# Patient Record
Sex: Male | Born: 1980 | Race: White | Hispanic: No | Marital: Married | Smoking: Former smoker
Health system: Southern US, Community
[De-identification: ages and names within clinical notes are randomized; demographics above are authoritative.]

## PROBLEM LIST (undated history)

## (undated) DIAGNOSIS — S82141A Displaced bicondylar fracture of right tibia, initial encounter for closed fracture: Secondary | ICD-10-CM

## (undated) DIAGNOSIS — E559 Vitamin D deficiency, unspecified: Secondary | ICD-10-CM

## (undated) DIAGNOSIS — S72401A Unspecified fracture of lower end of right femur, initial encounter for closed fracture: Secondary | ICD-10-CM

## (undated) DIAGNOSIS — S83206A Unspecified tear of unspecified meniscus, current injury, right knee, initial encounter: Secondary | ICD-10-CM

## (undated) DIAGNOSIS — T79A21A Traumatic compartment syndrome of right lower extremity, initial encounter: Secondary | ICD-10-CM

## (undated) HISTORY — PX: KNEE SURGERY: SHX244

---

## 1999-09-02 ENCOUNTER — Encounter: Payer: Self-pay | Admitting: Emergency Medicine

## 1999-09-02 ENCOUNTER — Emergency Department (HOSPITAL_COMMUNITY): Admission: EM | Admit: 1999-09-02 | Discharge: 1999-09-02 | Payer: Self-pay | Admitting: Emergency Medicine

## 2002-01-15 ENCOUNTER — Encounter: Payer: Self-pay | Admitting: Family Medicine

## 2002-01-15 ENCOUNTER — Ambulatory Visit (HOSPITAL_COMMUNITY): Admission: RE | Admit: 2002-01-15 | Discharge: 2002-01-15 | Payer: Self-pay | Admitting: Family Medicine

## 2015-09-26 ENCOUNTER — Emergency Department (HOSPITAL_COMMUNITY): Payer: 59

## 2015-09-26 ENCOUNTER — Encounter (HOSPITAL_COMMUNITY)
Admission: EM | Disposition: A | Discharge status: Home Health | Payer: Self-pay | Source: Home / Self Care | Attending: Orthopedic Surgery

## 2015-09-26 ENCOUNTER — Emergency Department (HOSPITAL_COMMUNITY): Payer: 59 | Admitting: Anesthesiology

## 2015-09-26 ENCOUNTER — Encounter (HOSPITAL_COMMUNITY): Payer: Self-pay | Admitting: Emergency Medicine

## 2015-09-26 ENCOUNTER — Inpatient Hospital Stay (HOSPITAL_COMMUNITY): Payer: 59

## 2015-09-26 ENCOUNTER — Inpatient Hospital Stay (HOSPITAL_COMMUNITY)
Admission: EM | Admit: 2015-09-26 | Discharge: 2015-09-30 | DRG: 481 | Disposition: A | Discharge status: Home Health | Payer: 59 | Attending: Orthopedic Surgery | Admitting: Orthopedic Surgery

## 2015-09-26 DIAGNOSIS — Z6841 Body Mass Index (BMI) 40.0 and over, adult: Secondary | ICD-10-CM | POA: Diagnosis not present

## 2015-09-26 DIAGNOSIS — E618 Deficiency of other specified nutrient elements: Secondary | ICD-10-CM | POA: Diagnosis present

## 2015-09-26 DIAGNOSIS — D62 Acute posthemorrhagic anemia: Secondary | ICD-10-CM | POA: Diagnosis not present

## 2015-09-26 DIAGNOSIS — S82291A Other fracture of shaft of right tibia, initial encounter for closed fracture: Secondary | ICD-10-CM | POA: Diagnosis present

## 2015-09-26 DIAGNOSIS — S82141A Displaced bicondylar fracture of right tibia, initial encounter for closed fracture: Secondary | ICD-10-CM | POA: Diagnosis present

## 2015-09-26 DIAGNOSIS — S72451A Displaced supracondylar fracture without intracondylar extension of lower end of right femur, initial encounter for closed fracture: Secondary | ICD-10-CM | POA: Diagnosis present

## 2015-09-26 DIAGNOSIS — M79651 Pain in right thigh: Secondary | ICD-10-CM | POA: Diagnosis present

## 2015-09-26 DIAGNOSIS — S72401A Unspecified fracture of lower end of right femur, initial encounter for closed fracture: Secondary | ICD-10-CM | POA: Diagnosis present

## 2015-09-26 DIAGNOSIS — M1711 Unilateral primary osteoarthritis, right knee: Secondary | ICD-10-CM | POA: Diagnosis present

## 2015-09-26 DIAGNOSIS — R52 Pain, unspecified: Secondary | ICD-10-CM

## 2015-09-26 DIAGNOSIS — S22088A Other fracture of T11-T12 vertebra, initial encounter for closed fracture: Secondary | ICD-10-CM | POA: Diagnosis present

## 2015-09-26 DIAGNOSIS — F17200 Nicotine dependence, unspecified, uncomplicated: Secondary | ICD-10-CM | POA: Diagnosis present

## 2015-09-26 DIAGNOSIS — F15188 Other stimulant abuse with other stimulant-induced disorder: Secondary | ICD-10-CM | POA: Diagnosis present

## 2015-09-26 DIAGNOSIS — T79A21A Traumatic compartment syndrome of right lower extremity, initial encounter: Secondary | ICD-10-CM | POA: Diagnosis present

## 2015-09-26 DIAGNOSIS — Y92488 Other paved roadways as the place of occurrence of the external cause: Secondary | ICD-10-CM | POA: Diagnosis not present

## 2015-09-26 DIAGNOSIS — Z419 Encounter for procedure for purposes other than remedying health state, unspecified: Secondary | ICD-10-CM

## 2015-09-26 DIAGNOSIS — S22089A Unspecified fracture of T11-T12 vertebra, initial encounter for closed fracture: Secondary | ICD-10-CM | POA: Diagnosis present

## 2015-09-26 DIAGNOSIS — S72409A Unspecified fracture of lower end of unspecified femur, initial encounter for closed fracture: Secondary | ICD-10-CM

## 2015-09-26 DIAGNOSIS — E58 Dietary calcium deficiency: Secondary | ICD-10-CM | POA: Diagnosis present

## 2015-09-26 DIAGNOSIS — E559 Vitamin D deficiency, unspecified: Secondary | ICD-10-CM | POA: Diagnosis present

## 2015-09-26 DIAGNOSIS — S7291XA Unspecified fracture of right femur, initial encounter for closed fracture: Secondary | ICD-10-CM

## 2015-09-26 HISTORY — DX: Displaced bicondylar fracture of right tibia, initial encounter for closed fracture: S82.141A

## 2015-09-26 HISTORY — PX: ORIF FEMUR FRACTURE: SHX2119

## 2015-09-26 HISTORY — DX: Traumatic compartment syndrome of right lower extremity, initial encounter: T79.A21A

## 2015-09-26 HISTORY — DX: Unspecified fracture of lower end of right femur, initial encounter for closed fracture: S72.401A

## 2015-09-26 HISTORY — DX: Unspecified tear of unspecified meniscus, current injury, right knee, initial encounter: S83.206A

## 2015-09-26 HISTORY — DX: Vitamin D deficiency, unspecified: E55.9

## 2015-09-26 LAB — LIPASE, BLOOD: Lipase: 76 U/L — ABNORMAL HIGH (ref 11–51)

## 2015-09-26 LAB — I-STAT CHEM 8, ED
BUN: 17 mg/dL (ref 6–20)
CHLORIDE: 103 mmol/L (ref 101–111)
CREATININE: 1 mg/dL (ref 0.61–1.24)
Calcium, Ion: 1.12 mmol/L — ABNORMAL LOW (ref 1.13–1.30)
Glucose, Bld: 126 mg/dL — ABNORMAL HIGH (ref 65–99)
HEMATOCRIT: 42 % (ref 39.0–52.0)
Hemoglobin: 14.3 g/dL (ref 13.0–17.0)
Potassium: 3.7 mmol/L (ref 3.5–5.1)
SODIUM: 141 mmol/L (ref 135–145)
TCO2: 24 mmol/L (ref 0–100)

## 2015-09-26 LAB — CBC
HEMATOCRIT: 42.3 % (ref 39.0–52.0)
HEMOGLOBIN: 13.7 g/dL (ref 13.0–17.0)
MCH: 29.2 pg (ref 26.0–34.0)
MCHC: 32.4 g/dL (ref 30.0–36.0)
MCV: 90.2 fL (ref 78.0–100.0)
Platelets: 212 10*3/uL (ref 150–400)
RBC: 4.69 MIL/uL (ref 4.22–5.81)
RDW: 13.6 % (ref 11.5–15.5)
WBC: 16.1 10*3/uL — ABNORMAL HIGH (ref 4.0–10.5)

## 2015-09-26 LAB — ABO/RH: ABO/RH(D): O POS

## 2015-09-26 LAB — TYPE AND SCREEN
ABO/RH(D): O POS
Antibody Screen: NEGATIVE

## 2015-09-26 LAB — COMPREHENSIVE METABOLIC PANEL
ALBUMIN: 4 g/dL (ref 3.5–5.0)
ALK PHOS: 72 U/L (ref 38–126)
ALT: 28 U/L (ref 17–63)
ANION GAP: 9 (ref 5–15)
AST: 24 U/L (ref 15–41)
BILIRUBIN TOTAL: 0.8 mg/dL (ref 0.3–1.2)
BUN: 16 mg/dL (ref 6–20)
CALCIUM: 9.4 mg/dL (ref 8.9–10.3)
CO2: 24 mmol/L (ref 22–32)
CREATININE: 1.07 mg/dL (ref 0.61–1.24)
Chloride: 107 mmol/L (ref 101–111)
GFR calc Af Amer: >60 mL/min (ref >60)
GFR calc non Af Amer: >60 mL/min (ref >60)
GLUCOSE: 130 mg/dL — AB (ref 65–99)
Potassium: 3.7 mmol/L (ref 3.5–5.1)
SODIUM: 140 mmol/L (ref 135–145)
TOTAL PROTEIN: 7.3 g/dL (ref 6.5–8.1)

## 2015-09-26 LAB — PROTIME-INR
INR: 1.05 (ref 0.00–1.49)
Prothrombin Time: 13.9 seconds (ref 11.6–15.2)

## 2015-09-26 SURGERY — OPEN REDUCTION INTERNAL FIXATION (ORIF) DISTAL FEMUR FRACTURE
Anesthesia: General | Site: Leg Upper | Laterality: Right

## 2015-09-26 MED ORDER — DEXTROSE 5 % IV SOLN
3.0000 g | INTRAVENOUS | Status: DC | PRN
  Administered 2015-09-26: 3 g via INTRAVENOUS

## 2015-09-26 MED ORDER — TETANUS-DIPHTH-ACELL PERTUSSIS 5-2.5-18.5 LF-MCG/0.5 IM SUSP
0.5000 mL | Freq: Once | INTRAMUSCULAR | Status: AC
  Administered 2015-09-26: 0.5 mL via INTRAMUSCULAR
  Filled 2015-09-26: qty 0.5

## 2015-09-26 MED ORDER — HYDROMORPHONE HCL 1 MG/ML IJ SOLN
1.0000 mg | Freq: Once | INTRAMUSCULAR | Status: AC
  Administered 2015-09-26: 1 mg via INTRAVENOUS
  Filled 2015-09-26: qty 1

## 2015-09-26 MED ORDER — FENTANYL CITRATE (PF) 250 MCG/5ML IJ SOLN
INTRAMUSCULAR | Status: AC
  Filled 2015-09-26: qty 5

## 2015-09-26 MED ORDER — SUCCINYLCHOLINE CHLORIDE 20 MG/ML IJ SOLN
INTRAMUSCULAR | Status: DC | PRN
  Administered 2015-09-26: 120 mg via INTRAVENOUS

## 2015-09-26 MED ORDER — SUGAMMADEX SODIUM 500 MG/5ML IV SOLN
INTRAVENOUS | Status: DC | PRN
  Administered 2015-09-26: 300 mg via INTRAVENOUS

## 2015-09-26 MED ORDER — BUPIVACAINE HCL (PF) 0.25 % IJ SOLN
INTRAMUSCULAR | Status: AC
  Filled 2015-09-26: qty 30

## 2015-09-26 MED ORDER — LACTATED RINGERS IV SOLN
INTRAVENOUS | Status: DC | PRN
  Administered 2015-09-26: 20:00:00 via INTRAVENOUS

## 2015-09-26 MED ORDER — PROPOFOL 10 MG/ML IV BOLUS
INTRAVENOUS | Status: AC
  Filled 2015-09-26: qty 20

## 2015-09-26 MED ORDER — MIDAZOLAM HCL 2 MG/2ML IJ SOLN
INTRAMUSCULAR | Status: AC
  Filled 2015-09-26: qty 2

## 2015-09-26 MED ORDER — MIDAZOLAM HCL 5 MG/5ML IJ SOLN
INTRAMUSCULAR | Status: DC | PRN
  Administered 2015-09-26: 2 mg via INTRAVENOUS

## 2015-09-26 MED ORDER — IOPAMIDOL (ISOVUE-300) INJECTION 61%
INTRAVENOUS | Status: AC
  Administered 2015-09-26: 100 mL
  Filled 2015-09-26: qty 100

## 2015-09-26 MED ORDER — ROCURONIUM BROMIDE 50 MG/5ML IV SOLN
INTRAVENOUS | Status: AC
  Filled 2015-09-26: qty 2

## 2015-09-26 MED ORDER — LIDOCAINE HCL (CARDIAC) 20 MG/ML IV SOLN
INTRAVENOUS | Status: DC | PRN
  Administered 2015-09-26: 100 mg via INTRATRACHEAL

## 2015-09-26 MED ORDER — SUGAMMADEX SODIUM 500 MG/5ML IV SOLN
INTRAVENOUS | Status: AC
  Filled 2015-09-26: qty 5

## 2015-09-26 MED ORDER — ONDANSETRON HCL 4 MG/2ML IJ SOLN
INTRAMUSCULAR | Status: DC | PRN
  Administered 2015-09-26: 4 mg via INTRAVENOUS

## 2015-09-26 MED ORDER — SUCCINYLCHOLINE CHLORIDE 200 MG/10ML IV SOSY
PREFILLED_SYRINGE | INTRAVENOUS | Status: AC
  Filled 2015-09-26: qty 10

## 2015-09-26 MED ORDER — ROCURONIUM BROMIDE 100 MG/10ML IV SOLN
INTRAVENOUS | Status: DC | PRN
  Administered 2015-09-26 (×2): 10 mg via INTRAVENOUS
  Administered 2015-09-26: 50 mg via INTRAVENOUS
  Administered 2015-09-26: 30 mg via INTRAVENOUS

## 2015-09-26 MED ORDER — PROPOFOL 10 MG/ML IV BOLUS
INTRAVENOUS | Status: DC | PRN
  Administered 2015-09-26: 300 mg via INTRAVENOUS

## 2015-09-26 MED ORDER — SODIUM CHLORIDE 0.9 % IV SOLN
Freq: Once | INTRAVENOUS | Status: AC
  Administered 2015-09-26: 14:00:00 via INTRAVENOUS

## 2015-09-26 MED ORDER — HYDROMORPHONE HCL 1 MG/ML IJ SOLN
1.0000 mg | INTRAMUSCULAR | Status: DC | PRN
  Administered 2015-09-26 (×4): 1 mg via INTRAVENOUS
  Filled 2015-09-26 (×4): qty 1

## 2015-09-26 MED ORDER — HYDROMORPHONE HCL 1 MG/ML IJ SOLN: 1.0000 mg | Freq: Once | INTRAMUSCULAR | Status: DC

## 2015-09-26 MED ORDER — HYDROMORPHONE HCL 1 MG/ML IJ SOLN
1.0000 mg | Freq: Once | INTRAMUSCULAR | Status: AC
Start: 2015-09-26 — End: 2015-09-26
  Administered 2015-09-26: 1 mg via INTRAVENOUS
  Filled 2015-09-26: qty 1

## 2015-09-26 MED ORDER — FENTANYL CITRATE (PF) 250 MCG/5ML IJ SOLN
INTRAMUSCULAR | Status: DC | PRN
  Administered 2015-09-26 (×3): 50 ug via INTRAVENOUS
  Administered 2015-09-26 (×3): 100 ug via INTRAVENOUS
  Administered 2015-09-26: 50 ug via INTRAVENOUS

## 2015-09-26 MED ORDER — ARTIFICIAL TEARS OP OINT
TOPICAL_OINTMENT | OPHTHALMIC | Status: AC
  Filled 2015-09-26: qty 3.5

## 2015-09-26 MED ORDER — LACTATED RINGERS IV SOLN
INTRAVENOUS | Status: DC | PRN
  Administered 2015-09-26 (×2): via INTRAVENOUS

## 2015-09-26 MED ORDER — LIDOCAINE 2% (20 MG/ML) 5 ML SYRINGE
INTRAMUSCULAR | Status: AC
  Filled 2015-09-26: qty 10

## 2015-09-26 MED ORDER — 0.9 % SODIUM CHLORIDE (POUR BTL) OPTIME
TOPICAL | Status: DC | PRN
  Administered 2015-09-26: 1000 mL

## 2015-09-26 SURGICAL SUPPLY — 84 items
BANDAGE ELASTIC 4 VELCRO ST LF (GAUZE/BANDAGES/DRESSINGS) ×3 IMPLANT
BANDAGE ELASTIC 6 VELCRO ST LF (GAUZE/BANDAGES/DRESSINGS) ×3 IMPLANT
BIT DRILL 100X2.5XANTM LCK (BIT) ×1 IMPLANT
BIT DRILL 4.3 (BIT) ×2
BIT DRILL 4.3MM (BIT) ×1
BIT DRILL 4.3X300MM (BIT) ×1 IMPLANT
BIT DRL 100X2.5XANTM LCK (BIT) ×1
BLADE SURG ROTATE 9660 (MISCELLANEOUS) IMPLANT
BNDG ELASTIC 6X15 VLCR STRL LF (GAUZE/BANDAGES/DRESSINGS) ×3 IMPLANT
BNDG GAUZE ELAST 4 BULKY (GAUZE/BANDAGES/DRESSINGS) ×3 IMPLANT
BRUSH SCRUB DISP (MISCELLANEOUS) ×6 IMPLANT
CANISTER SUCT 3000ML PPV (MISCELLANEOUS) ×3 IMPLANT
CAP LOCK NCB (Cap) ×21 IMPLANT
COVER SURGICAL LIGHT HANDLE (MISCELLANEOUS) ×3 IMPLANT
DRAPE C-ARM 42X72 X-RAY (DRAPES) ×3 IMPLANT
DRAPE C-ARMOR (DRAPES) ×3 IMPLANT
DRAPE IMP U-DRAPE 54X76 (DRAPES) ×3 IMPLANT
DRAPE ORTHO SPLIT 77X108 STRL (DRAPES) ×6
DRAPE SURG ORHT 6 SPLT 77X108 (DRAPES) ×3 IMPLANT
DRAPE U-SHAPE 47X51 STRL (DRAPES) ×3 IMPLANT
DRILL BIT 2.5MM (BIT) ×2
DRILL BIT NCB 2.5 (BIT) ×3 IMPLANT
DRSG ADAPTIC 3X8 NADH LF (GAUZE/BANDAGES/DRESSINGS) ×3 IMPLANT
DRSG MEPILEX BORDER 4X4 (GAUZE/BANDAGES/DRESSINGS) ×3 IMPLANT
DRSG PAD ABDOMINAL 8X10 ST (GAUZE/BANDAGES/DRESSINGS) ×12 IMPLANT
DRSG VAC ATS LRG SENSATRAC (GAUZE/BANDAGES/DRESSINGS) ×3 IMPLANT
ELECT REM PT RETURN 9FT ADLT (ELECTROSURGICAL) ×3
ELECTRODE REM PT RTRN 9FT ADLT (ELECTROSURGICAL) ×1 IMPLANT
EVACUATOR 1/8 PVC DRAIN (DRAIN) IMPLANT
EVACUATOR 3/16  PVC DRAIN (DRAIN)
EVACUATOR 3/16 PVC DRAIN (DRAIN) IMPLANT
GAUZE SPONGE 4X4 12PLY STRL (GAUZE/BANDAGES/DRESSINGS) ×3 IMPLANT
GLOVE BIO SURGEON STRL SZ7.5 (GLOVE) ×3 IMPLANT
GLOVE BIO SURGEON STRL SZ8 (GLOVE) ×6 IMPLANT
GLOVE BIOGEL PI IND STRL 7.0 (GLOVE) ×1 IMPLANT
GLOVE BIOGEL PI IND STRL 7.5 (GLOVE) ×1 IMPLANT
GLOVE BIOGEL PI IND STRL 8 (GLOVE) ×1 IMPLANT
GLOVE BIOGEL PI INDICATOR 7.0 (GLOVE) ×2
GLOVE BIOGEL PI INDICATOR 7.5 (GLOVE) ×2
GLOVE BIOGEL PI INDICATOR 8 (GLOVE) ×2
GLOVE SURG SS PI 7.0 STRL IVOR (GLOVE) ×3 IMPLANT
GOWN STRL REUS W/ TWL LRG LVL3 (GOWN DISPOSABLE) ×2 IMPLANT
GOWN STRL REUS W/ TWL XL LVL3 (GOWN DISPOSABLE) ×1 IMPLANT
GOWN STRL REUS W/TWL LRG LVL3 (GOWN DISPOSABLE) ×4
GOWN STRL REUS W/TWL XL LVL3 (GOWN DISPOSABLE) ×2
IMMOBILIZER KNEE 22 UNIV (SOFTGOODS) ×3 IMPLANT
K-WIRE 2.0 (WIRE) ×2
K-WIRE FXSTD 280X2XNS SS (WIRE) ×1
KIT BASIN OR (CUSTOM PROCEDURE TRAY) ×3 IMPLANT
KIT ROOM TURNOVER OR (KITS) ×3 IMPLANT
KWIRE FXSTD 280X2XNS SS (WIRE) ×1 IMPLANT
NEEDLE 22X1 1/2 (OR ONLY) (NEEDLE) IMPLANT
NS IRRIG 1000ML POUR BTL (IV SOLUTION) ×3 IMPLANT
PACK TOTAL JOINT (CUSTOM PROCEDURE TRAY) ×3 IMPLANT
PACK UNIVERSAL I (CUSTOM PROCEDURE TRAY) ×3 IMPLANT
PAD ARMBOARD 7.5X6 YLW CONV (MISCELLANEOUS) ×6 IMPLANT
PAD CAST 4YDX4 CTTN HI CHSV (CAST SUPPLIES) ×1 IMPLANT
PADDING CAST COTTON 4X4 STRL (CAST SUPPLIES) ×2
PADDING CAST COTTON 6X4 STRL (CAST SUPPLIES) ×3 IMPLANT
PLATE DISTAL FEMUR 15H 317M RT (Plate) ×3 IMPLANT
SCREW CANCELLOUS 5.0X90 (Screw) ×6 IMPLANT
SCREW CORTICAL NCB 5.0X40 (Screw) ×3 IMPLANT
SCREW NCB 5.0X38 (Screw) ×9 IMPLANT
SCREW NCB 5.0X85MM (Screw) ×9 IMPLANT
SPONGE GAUZE 4X4 12PLY STER LF (GAUZE/BANDAGES/DRESSINGS) ×3 IMPLANT
SPONGE LAP 18X18 X RAY DECT (DISPOSABLE) ×9 IMPLANT
STAPLER VISISTAT 35W (STAPLE) ×3 IMPLANT
SUCTION FRAZIER HANDLE 10FR (MISCELLANEOUS) ×2
SUCTION TUBE FRAZIER 10FR DISP (MISCELLANEOUS) ×1 IMPLANT
SUT ETHILON 3 0 PS 1 (SUTURE) ×3 IMPLANT
SUT PROLENE 0 CT 2 (SUTURE) IMPLANT
SUT VIC AB 0 CT1 27 (SUTURE)
SUT VIC AB 0 CT1 27XBRD ANBCTR (SUTURE) IMPLANT
SUT VIC AB 1 CT1 27 (SUTURE) ×4
SUT VIC AB 1 CT1 27XBRD ANBCTR (SUTURE) ×2 IMPLANT
SUT VIC AB 2-0 CT1 27 (SUTURE)
SUT VIC AB 2-0 CT1 TAPERPNT 27 (SUTURE) IMPLANT
SYR 20ML ECCENTRIC (SYRINGE) IMPLANT
TAPE CLOTH SURG 4X10 WHT LF (GAUZE/BANDAGES/DRESSINGS) ×3 IMPLANT
TOWEL OR 17X24 6PK STRL BLUE (TOWEL DISPOSABLE) ×6 IMPLANT
TOWEL OR 17X26 10 PK STRL BLUE (TOWEL DISPOSABLE) ×9 IMPLANT
TRAY FOLEY CATH 16FRSI W/METER (SET/KITS/TRAYS/PACK) ×3 IMPLANT
WATER STERILE IRR 1000ML POUR (IV SOLUTION) ×6 IMPLANT
YANKAUER SUCT BULB TIP NO VENT (SUCTIONS) ×3 IMPLANT
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
## 2015-09-26 NOTE — ED Notes (Signed)
Consent form signed.

## 2015-09-26 NOTE — ED Notes (Signed)
Pt returned from CT °

## 2015-09-26 NOTE — Progress Notes (Signed)
Orthopedic Tech Progress Note Patient Details:  Guy Medina [birth year] [id number]  Ortho Devices Type of Ortho Device: Ace wrap, Post (long leg) splint Ortho Device/Splint Location: RLE Ortho Device/Splint Interventions: Ordered, Application   Jennye Moccasin 09/26/2015, 5:10 PM
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
## 2015-09-26 NOTE — ED Notes (Signed)
Taken to short stay at this time.  Pain well controlled

## 2015-09-26 NOTE — ED Notes (Signed)
Pt returns from radiology. 
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
## 2015-09-26 NOTE — Anesthesia Procedure Notes (Signed)
Procedure Name: Intubation Date/Time: 09/26/2015 8:37 PM Performed by: Brien Mates D Pre-anesthesia Checklist: Patient identified, Emergency Drugs available, Patient being monitored, Suction available and Timeout performed Patient Re-evaluated:Patient Re-evaluated prior to inductionOxygen Delivery Method: Circle system utilized Preoxygenation: Pre-oxygenation with 100% oxygen Intubation Type: IV induction Laryngoscope Size: Glidescope (Elective) Grade View: Grade I Tube type: Oral Tube size: 8.0 mm Number of attempts: 1 Airway Equipment and Method: Stylet and Video-laryngoscopy Placement Confirmation: ETT inserted through vocal cords under direct vision,  positive ETCO2 and breath sounds checked- equal and bilateral Secured at: 23 cm Tube secured with: Tape Dental Injury: Teeth and Oropharynx as per pre-operative assessment

## 2015-09-26 NOTE — Anesthesia Preprocedure Evaluation (Signed)
Anesthesia Evaluation  Patient identified by MRN, date of birth, ID band Patient awake    Reviewed: Allergy & Precautions, NPO status   History of Anesthesia Complications Negative for: history of anesthetic complications  Airway Mallampati: I   Neck ROM: Full    Dental  (+) Teeth Intact   Pulmonary former smoker,    breath sounds clear to auscultation       Cardiovascular  Rhythm:Regular Rate:Normal     Neuro/Psych    GI/Hepatic negative GI ROS, Neg liver ROS,   Endo/Other  negative endocrine ROSMorbid obesity  Renal/GU negative Renal ROS     Musculoskeletal Femur fracture   Abdominal (+) + obese,   Peds  Hematology   Anesthesia Other Findings   Reproductive/Obstetrics                             Anesthesia Physical Anesthesia Plan  ASA: II and emergent  Anesthesia Plan: General   Post-op Pain Management:    Induction: Intravenous  Airway Management Planned:   Additional Equipment:   Intra-op Plan:   Post-operative Plan: Extubation in OR  Informed Consent: I have reviewed the patients History and Physical, chart, labs and discussed the procedure including the risks, benefits and alternatives for the proposed anesthesia with the patient or authorized representative who has indicated his/her understanding and acceptance.   Dental advisory given  Plan Discussed with: CRNA and Surgeon  Anesthesia Plan Comments:         Anesthesia Quick Evaluation

## 2015-09-27 ENCOUNTER — Inpatient Hospital Stay (HOSPITAL_COMMUNITY): Payer: 59

## 2015-09-27 DIAGNOSIS — S72409A Unspecified fracture of lower end of unspecified femur, initial encounter for closed fracture: Secondary | ICD-10-CM | POA: Diagnosis present

## 2015-09-27 LAB — CREATININE, SERUM
Creatinine, Ser: 1.12 mg/dL (ref 0.61–1.24)
GFR calc Af Amer: >60 mL/min (ref >60)
GFR calc non Af Amer: >60 mL/min (ref >60)

## 2015-09-27 LAB — CBC
HEMATOCRIT: 34.4 % — AB (ref 39.0–52.0)
Hemoglobin: 10.5 g/dL — ABNORMAL LOW (ref 13.0–17.0)
MCH: 28.1 pg (ref 26.0–34.0)
MCHC: 30.5 g/dL (ref 30.0–36.0)
MCV: 92 fL (ref 78.0–100.0)
PLATELETS: 174 10*3/uL (ref 150–400)
RBC: 3.74 MIL/uL — ABNORMAL LOW (ref 4.22–5.81)
RDW: 14 % (ref 11.5–15.5)
WBC: 12.6 10*3/uL — ABNORMAL HIGH (ref 4.0–10.5)

## 2015-09-27 LAB — APTT: aPTT: 27 seconds (ref 24–37)

## 2015-09-27 MED ORDER — CHLORHEXIDINE GLUCONATE 4 % EX LIQD: 60.0000 mL | Freq: Once | CUTANEOUS | Status: DC

## 2015-09-27 MED ORDER — POTASSIUM CHLORIDE 2 MEQ/ML IV SOLN: INTRAVENOUS | Status: DC

## 2015-09-27 MED ORDER — METOCLOPRAMIDE HCL 5 MG PO TABS: 5.0000 mg | ORAL_TABLET | Freq: Three times a day (TID) | ORAL | Status: DC | PRN

## 2015-09-27 MED ORDER — PHENOL 1.4 % MT LIQD: 1.0000 | OROMUCOSAL | Status: DC | PRN

## 2015-09-27 MED ORDER — ACETAMINOPHEN 325 MG PO TABS
650.0000 mg | ORAL_TABLET | Freq: Four times a day (QID) | ORAL | Status: DC | PRN
  Administered 2015-09-29 (×2): 650 mg via ORAL
  Filled 2015-09-27 (×2): qty 2

## 2015-09-27 MED ORDER — DEXTROSE 5 % IV SOLN
1000.0000 mg | Freq: Four times a day (QID) | INTRAVENOUS | Status: DC
  Administered 2015-09-27 – 2015-09-28 (×3): 1000 mg via INTRAVENOUS
  Filled 2015-09-27 (×18): qty 10

## 2015-09-27 MED ORDER — ENOXAPARIN SODIUM 40 MG/0.4ML ~~LOC~~ SOLN
40.0000 mg | SUBCUTANEOUS | Status: DC
  Administered 2015-09-27 – 2015-09-29 (×2): 40 mg via SUBCUTANEOUS
  Filled 2015-09-27 (×3): qty 0.4

## 2015-09-27 MED ORDER — DOCUSATE SODIUM 100 MG PO CAPS
100.0000 mg | ORAL_CAPSULE | Freq: Two times a day (BID) | ORAL | Status: DC
  Administered 2015-09-27 – 2015-09-30 (×6): 100 mg via ORAL
  Filled 2015-09-27 (×7): qty 1

## 2015-09-27 MED ORDER — ACETAMINOPHEN 650 MG RE SUPP: 650.0000 mg | Freq: Four times a day (QID) | RECTAL | Status: DC | PRN

## 2015-09-27 MED ORDER — POTASSIUM CHLORIDE IN NACL 20-0.9 MEQ/L-% IV SOLN
INTRAVENOUS | Status: DC
  Administered 2015-09-27 – 2015-09-28 (×2): via INTRAVENOUS
  Filled 2015-09-27 (×4): qty 1000

## 2015-09-27 MED ORDER — CEFAZOLIN SODIUM-DEXTROSE 2-4 GM/100ML-% IV SOLN
2.0000 g | Freq: Four times a day (QID) | INTRAVENOUS | Status: AC
  Administered 2015-09-27 (×2): 2 g via INTRAVENOUS
  Filled 2015-09-27 (×2): qty 100

## 2015-09-27 MED ORDER — HYDROMORPHONE HCL 1 MG/ML IJ SOLN: 0.2500 mg | INTRAMUSCULAR | Status: DC | PRN

## 2015-09-27 MED ORDER — OXYCODONE-ACETAMINOPHEN 7.5-325 MG PO TABS
1.0000 | ORAL_TABLET | Freq: Four times a day (QID) | ORAL | Status: DC | PRN
  Administered 2015-09-28 (×2): 1 via ORAL
  Filled 2015-09-27 (×2): qty 1

## 2015-09-27 MED ORDER — HYDROMORPHONE HCL 1 MG/ML IJ SOLN
INTRAMUSCULAR | Status: AC
  Filled 2015-09-27: qty 1

## 2015-09-27 MED ORDER — PROMETHAZINE HCL 25 MG/ML IJ SOLN: 6.2500 mg | INTRAMUSCULAR | Status: DC | PRN

## 2015-09-27 MED ORDER — DEXTROSE 5 % IV SOLN: 3.0000 g | INTRAVENOUS | Status: DC

## 2015-09-27 MED ORDER — POLYETHYLENE GLYCOL 3350 17 G PO PACK
17.0000 g | PACK | Freq: Every day | ORAL | Status: DC
  Administered 2015-09-29 – 2015-09-30 (×2): 17 g via ORAL
  Filled 2015-09-27 (×3): qty 1

## 2015-09-27 MED ORDER — ALUM & MAG HYDROXIDE-SIMETH 200-200-20 MG/5ML PO SUSP: 30.0000 mL | ORAL | Status: DC | PRN

## 2015-09-27 MED ORDER — ACETAMINOPHEN 325 MG PO TABS: 650.0000 mg | ORAL_TABLET | Freq: Four times a day (QID) | ORAL | Status: DC | PRN

## 2015-09-27 MED ORDER — ONDANSETRON HCL 4 MG PO TABS: 4.0000 mg | ORAL_TABLET | Freq: Four times a day (QID) | ORAL | Status: DC | PRN

## 2015-09-27 MED ORDER — METHOCARBAMOL 500 MG PO TABS
1000.0000 mg | ORAL_TABLET | Freq: Four times a day (QID) | ORAL | Status: DC
  Administered 2015-09-27 – 2015-09-30 (×10): 1000 mg via ORAL
  Filled 2015-09-27 (×11): qty 2

## 2015-09-27 MED ORDER — OXYCODONE HCL 5 MG PO TABS
5.0000 mg | ORAL_TABLET | ORAL | Status: DC | PRN
  Administered 2015-09-27 (×4): 10 mg via ORAL
  Administered 2015-09-28: 5 mg via ORAL
  Administered 2015-09-28 – 2015-09-30 (×6): 10 mg via ORAL
  Filled 2015-09-27: qty 2
  Filled 2015-09-27: qty 1
  Filled 2015-09-27 (×8): qty 2

## 2015-09-27 MED ORDER — ONDANSETRON HCL 4 MG/2ML IJ SOLN: 4.0000 mg | Freq: Four times a day (QID) | INTRAMUSCULAR | Status: DC | PRN

## 2015-09-27 MED ORDER — POVIDONE-IODINE 10 % EX SWAB
2.0000 | Freq: Once | CUTANEOUS | Status: DC
Start: 2015-09-27 — End: 2015-09-27

## 2015-09-27 MED ORDER — HYDROMORPHONE HCL 1 MG/ML IJ SOLN
0.2500 mg | INTRAMUSCULAR | Status: DC | PRN
  Administered 2015-09-27: 0.5 mg via INTRAVENOUS
  Filled 2015-09-27: qty 1

## 2015-09-27 MED ORDER — METOCLOPRAMIDE HCL 5 MG/ML IJ SOLN: 5.0000 mg | Freq: Three times a day (TID) | INTRAMUSCULAR | Status: DC | PRN

## 2015-09-27 MED ORDER — HYDROMORPHONE HCL 1 MG/ML IJ SOLN
0.5000 mg | INTRAMUSCULAR | Status: DC | PRN
  Administered 2015-09-27 – 2015-09-30 (×12): 1 mg via INTRAVENOUS
  Filled 2015-09-27 (×13): qty 1

## 2015-09-27 MED ORDER — MENTHOL 3 MG MT LOZG: 1.0000 | LOZENGE | OROMUCOSAL | Status: DC | PRN

## 2015-09-27 NOTE — Progress Notes (Signed)
Orthopaedic Trauma Service (OTS)  Subjective: 1 Day Post-Op Procedure(s) (LRB): OPEN REDUCTION INTERNAL FIXATION (ORIF) DISTAL FEMUR FRACTURE (Right) Patient reports pain as moderate and severe.   Requiring IV Dilaudid in addition to pills. Denies tingling or motor loss.  Objective: Current Vitals Blood pressure 130/73, pulse 84, temperature 99 F (37.2 C), temperature source Oral, resp. rate 14, height 6' (1.829 m), weight (!) 149.7 kg (330 lb), SpO2 97 %. Vital signs in last 24 hours: Temp:  [97.9 F (36.6 C)-99.2 F (37.3 C)] 99 F (37.2 C) (07/23 0910) Pulse Rate:  [83-121] 84 (07/23 0910) Resp:  [11-22] 14 (07/23 0910) BP: (101-131)/(60-84) 130/73 (07/23 0910) SpO2:  [90 %-99 %] 97 % (07/23 0910) Weight:  [149.7 kg (330 lb)] 149.7 kg (330 lb) (07/22 1546)  Intake/Output from previous day: 07/22 0701 - 07/23 0700 In: 2500 [I.V.:2500] Out: 1450 [Urine:1200; Drains:50; Blood:200]  LABS  Recent Labs  09/26/15 1426 09/26/15 1428 09/27/15 0534  HGB 14.3 13.7 10.5*    Recent Labs  09/26/15 1428 09/27/15 0534  WBC 16.1* 12.6*  RBC 4.69 3.74*  HCT 42.3 34.4*  PLT 212 174    Recent Labs  09/26/15 1426 09/26/15 1427 09/27/15 0534  NA 141 140  --   K 3.7 3.7  --   CL 103 107  --   CO2  --  24  --   BUN 17 16  --   CREATININE 1.00 1.07 1.12  GLUCOSE 126* 130*  --   CALCIUM  --  9.4  --     Recent Labs  09/26/15 1427  INR 1.05    Physical Exam  Lungs without audible wheezing RLE Dressing intact, clean, dry  Edema/ swelling controlled  Sens: DPN, SPN, TN intact  Motor: EHL, FHL, and lessor toe ext and flex all intact  Brisk cap refill, warm to touch Vac holding  Assessment/Plan: 1 Day Post-Op Procedure(s) (LRB): OPEN REDUCTION INTERNAL FIXATION (ORIF) DISTAL FEMUR FRACTURE (Right)  1. NWB on RLE 2. Return to OR tomorrow pm for closure of thigh wound, npo post mn 3. Lovenox  Myrene Galas, MD [facility],  PC [phone] (p)  09/27/2015, 11:42 AM

## 2015-09-27 NOTE — Brief Op Note (Signed)
09/26/2015 - 09/27/2015  12:09 AM  PATIENT:  Guy Medina  35 y.o. male  PRE-OPERATIVE DIAGNOSIS:   1. Right comminuted distal femur fracture with intracondylar extension 2. Right thigh compartment syndrome  POST-OPERATIVE DIAGNOSIS:   1. Right comminuted distal femur fracture with intracondylar extension 2. Right thigh compartment syndrome 3. Acute tibial plateau fracture, posterior (PCL avulsion fracture)  PROCEDURE:  Procedure(s): 1. OPEN REDUCTION INTERNAL FIXATION (ORIF) DISTAL FEMUR FRACTURE (Right) WITH INTERCONDYLAR EXTENSION 2. RIGHT THIGH LATERAL FASCIOTOMY 3. APPLICATION OF LARGE WOUND VAC 4. Stress flouro of tibial plateau fracture  SURGEON:  Surgeon(s) and Role:    * Myrene Galas, MD - Primary  PHYSICIAN ASSISTANT: Montez Morita, PAC  ANESTHESIA:   general  EBL:  Total I/O In: 2500 [I.V.:2500] Out: 1100 [Urine:900; Blood:200]  BLOOD ADMINISTERED:none  DRAINS: none   LOCAL MEDICATIONS USED:  NONE  SPECIMEN:  No Specimen  DISPOSITION OF SPECIMEN:  N/A  COUNTS:  YES  TOURNIQUET:  * No tourniquets in log *  DICTATION: .Other Dictation: Dictation Number [id number]  PLAN OF CARE: Admit to inpatient   PATIENT DISPOSITION:  PACU - hemodynamically stable.   Delay start of Pharmacological VTE agent (>24hrs) due to surgical blood loss or risk of bleeding: yes

## 2015-09-27 NOTE — Progress Notes (Signed)
Orthopedic Tech Progress Note Patient Details:  Guy Medina 06-Jan-1981 [id number]  Patient ID: Guy Medina, male   DOB: December 08, 1980, 35 y.o.   MRN: [id number]   Guy Medina 09/27/2015, 4:36 PM Ortho visit for compartment syndrome kit.
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
## 2015-09-27 NOTE — Transfer of Care (Signed)
Immediate Anesthesia Transfer of Care Note  Patient: Guy Medina  Procedure(s) Performed: Procedure(s): OPEN REDUCTION INTERNAL FIXATION (ORIF) DISTAL FEMUR FRACTURE (Right)  Patient Location: PACU  Anesthesia Type:General  Level of Consciousness: sedated  Airway & Oxygen Therapy: Patient Spontanous Breathing and Patient connected to face mask oxygen  Post-op Assessment: Report given to RN and Post -op Vital signs reviewed and stable  Post vital signs: Reviewed and stable  Last Vitals:  Filed Vitals:   09/27/15 0005 09/27/15 0006  BP:    Pulse: 121 121  Temp:    Resp: 17 17    Last Pain:  Filed Vitals:   09/27/15 0007  PainSc: 3          Complications: No apparent anesthesia complications

## 2015-09-28 ENCOUNTER — Inpatient Hospital Stay (HOSPITAL_COMMUNITY): Payer: 59 | Admitting: Certified Registered Nurse Anesthetist

## 2015-09-28 ENCOUNTER — Encounter (HOSPITAL_COMMUNITY)
Admission: EM | Disposition: A | Discharge status: Home Health | Payer: Self-pay | Source: Home / Self Care | Attending: Orthopedic Surgery

## 2015-09-28 ENCOUNTER — Encounter (HOSPITAL_COMMUNITY): Payer: Self-pay | Admitting: General Practice

## 2015-09-28 HISTORY — PX: ORIF FEMUR FRACTURE: SHX2119

## 2015-09-28 HISTORY — PX: DEBRIDEMENT AND CLOSURE WOUND: SHX5614

## 2015-09-28 HISTORY — PX: I&D EXTREMITY: SHX5045

## 2015-09-28 LAB — TSH: TSH: 1.135 u[IU]/mL (ref 0.350–4.500)

## 2015-09-28 LAB — CBC
HEMATOCRIT: 26 % — AB (ref 39.0–52.0)
HEMOGLOBIN: 8.3 g/dL — AB (ref 13.0–17.0)
MCH: 29.1 pg (ref 26.0–34.0)
MCHC: 31.9 g/dL (ref 30.0–36.0)
MCV: 91.2 fL (ref 78.0–100.0)
Platelets: 119 10*3/uL — ABNORMAL LOW (ref 150–400)
RBC: 2.85 MIL/uL — AB (ref 4.22–5.81)
RDW: 13.8 % (ref 11.5–15.5)
WBC: 10.5 10*3/uL (ref 4.0–10.5)

## 2015-09-28 LAB — BASIC METABOLIC PANEL
ANION GAP: 5 (ref 5–15)
BUN: 5 mg/dL — ABNORMAL LOW (ref 6–20)
CHLORIDE: 103 mmol/L (ref 101–111)
CO2: 27 mmol/L (ref 22–32)
Calcium: 7.7 mg/dL — ABNORMAL LOW (ref 8.9–10.3)
Creatinine, Ser: 0.79 mg/dL (ref 0.61–1.24)
GFR calc non Af Amer: >60 mL/min (ref >60)
Glucose, Bld: 121 mg/dL — ABNORMAL HIGH (ref 65–99)
POTASSIUM: 3.9 mmol/L (ref 3.5–5.1)
Sodium: 135 mmol/L (ref 135–145)

## 2015-09-28 LAB — MAGNESIUM: MAGNESIUM: 2.1 mg/dL (ref 1.7–2.4)

## 2015-09-28 LAB — PHOSPHORUS: PHOSPHORUS: 1.9 mg/dL — AB (ref 2.5–4.6)

## 2015-09-28 LAB — MRSA PCR SCREENING: MRSA BY PCR: NEGATIVE

## 2015-09-28 SURGERY — IRRIGATION AND DEBRIDEMENT EXTREMITY
Anesthesia: General | Site: Thigh | Laterality: Right

## 2015-09-28 MED ORDER — PROPOFOL 10 MG/ML IV BOLUS
INTRAVENOUS | Status: DC | PRN
  Administered 2015-09-28: 200 mg via INTRAVENOUS

## 2015-09-28 MED ORDER — LACTATED RINGERS IV SOLN
INTRAVENOUS | Status: DC
  Administered 2015-09-28: 16:00:00 via INTRAVENOUS

## 2015-09-28 MED ORDER — ROCURONIUM BROMIDE 50 MG/5ML IV SOLN
INTRAVENOUS | Status: AC
  Filled 2015-09-28: qty 1

## 2015-09-28 MED ORDER — MIDAZOLAM HCL 2 MG/2ML IJ SOLN
INTRAMUSCULAR | Status: AC
  Filled 2015-09-28: qty 2

## 2015-09-28 MED ORDER — DEXTROSE 5 % IV SOLN
3.0000 g | INTRAVENOUS | Status: AC
  Administered 2015-09-28: 3 g via INTRAVENOUS
  Filled 2015-09-28 (×3): qty 3000

## 2015-09-28 MED ORDER — FENTANYL CITRATE (PF) 250 MCG/5ML IJ SOLN
INTRAMUSCULAR | Status: DC | PRN
  Administered 2015-09-28 (×2): 50 ug via INTRAVENOUS

## 2015-09-28 MED ORDER — OXYCODONE HCL 5 MG PO TABS
ORAL_TABLET | ORAL | Status: AC
  Filled 2015-09-28: qty 2

## 2015-09-28 MED ORDER — SUGAMMADEX SODIUM 500 MG/5ML IV SOLN
INTRAVENOUS | Status: DC | PRN
  Administered 2015-09-28: 300 mg via INTRAVENOUS

## 2015-09-28 MED ORDER — ONDANSETRON HCL 4 MG/2ML IJ SOLN
INTRAMUSCULAR | Status: DC | PRN
  Administered 2015-09-28: 4 mg via INTRAVENOUS

## 2015-09-28 MED ORDER — SUCCINYLCHOLINE CHLORIDE 200 MG/10ML IV SOSY
PREFILLED_SYRINGE | INTRAVENOUS | Status: DC | PRN
  Administered 2015-09-28: 130 mg via INTRAVENOUS

## 2015-09-28 MED ORDER — ONDANSETRON HCL 4 MG/2ML IJ SOLN
INTRAMUSCULAR | Status: AC
  Filled 2015-09-28: qty 2

## 2015-09-28 MED ORDER — FENTANYL CITRATE (PF) 250 MCG/5ML IJ SOLN
INTRAMUSCULAR | Status: AC
Start: 2015-09-28 — End: 2015-09-28
  Filled 2015-09-28: qty 5

## 2015-09-28 MED ORDER — MIDAZOLAM HCL 2 MG/2ML IJ SOLN
INTRAMUSCULAR | Status: DC | PRN
  Administered 2015-09-28: 2 mg via INTRAVENOUS

## 2015-09-28 MED ORDER — PROPOFOL 10 MG/ML IV BOLUS
INTRAVENOUS | Status: AC
  Filled 2015-09-28: qty 20

## 2015-09-28 MED ORDER — SODIUM CHLORIDE 0.9 % IR SOLN
Status: DC | PRN
  Administered 2015-09-28: 1000 mL

## 2015-09-28 MED ORDER — HYDROMORPHONE HCL 1 MG/ML IJ SOLN: 0.2500 mg | INTRAMUSCULAR | Status: DC | PRN

## 2015-09-28 MED ORDER — PROMETHAZINE HCL 25 MG/ML IJ SOLN: 6.2500 mg | INTRAMUSCULAR | Status: DC | PRN

## 2015-09-28 MED ORDER — FENTANYL CITRATE (PF) 100 MCG/2ML IJ SOLN
100.0000 ug | Freq: Once | INTRAMUSCULAR | Status: AC
  Administered 2015-09-28: 100 ug via INTRAVENOUS

## 2015-09-28 MED ORDER — SUGAMMADEX SODIUM 200 MG/2ML IV SOLN
INTRAVENOUS | Status: AC
  Filled 2015-09-28: qty 2

## 2015-09-28 MED ORDER — LIDOCAINE 2% (20 MG/ML) 5 ML SYRINGE
INTRAMUSCULAR | Status: DC | PRN
  Administered 2015-09-28: 100 mg via INTRAVENOUS

## 2015-09-28 MED ORDER — LIDOCAINE 2% (20 MG/ML) 5 ML SYRINGE
INTRAMUSCULAR | Status: AC
  Filled 2015-09-28: qty 5

## 2015-09-28 MED ORDER — FENTANYL CITRATE (PF) 100 MCG/2ML IJ SOLN
INTRAMUSCULAR | Status: AC
  Administered 2015-09-28: 100 ug via INTRAVENOUS
  Filled 2015-09-28: qty 2

## 2015-09-28 MED ORDER — ROCURONIUM BROMIDE 100 MG/10ML IV SOLN
INTRAVENOUS | Status: DC | PRN
  Administered 2015-09-28: 20 mg via INTRAVENOUS
  Administered 2015-09-28: 30 mg via INTRAVENOUS

## 2015-09-28 SURGICAL SUPPLY — 63 items
BANDAGE ELASTIC 4 VELCRO ST LF (GAUZE/BANDAGES/DRESSINGS) ×4 IMPLANT
BANDAGE ELASTIC 6 VELCRO ST LF (GAUZE/BANDAGES/DRESSINGS) ×4 IMPLANT
BLADE SURG 10 STRL SS (BLADE) ×4 IMPLANT
BNDG COHESIVE 4X5 TAN STRL (GAUZE/BANDAGES/DRESSINGS) ×4 IMPLANT
BNDG COHESIVE 6X5 TAN STRL LF (GAUZE/BANDAGES/DRESSINGS) ×4 IMPLANT
BNDG ELASTIC 6X15 VLCR STRL LF (GAUZE/BANDAGES/DRESSINGS) ×4 IMPLANT
BNDG GAUZE ELAST 4 BULKY (GAUZE/BANDAGES/DRESSINGS) IMPLANT
BNDG GAUZE STRTCH 6 (GAUZE/BANDAGES/DRESSINGS) ×12 IMPLANT
BRUSH SCRUB DISP (MISCELLANEOUS) ×12 IMPLANT
COVER SURGICAL LIGHT HANDLE (MISCELLANEOUS) ×4 IMPLANT
DRAPE ORTHO SPLIT 77X108 STRL (DRAPES) ×4
DRAPE SURG ORHT 6 SPLT 77X108 (DRAPES) ×4 IMPLANT
DRAPE U-SHAPE 47X51 STRL (DRAPES) ×4 IMPLANT
DRSG ADAPTIC 3X8 NADH LF (GAUZE/BANDAGES/DRESSINGS) ×4 IMPLANT
DRSG MEPILEX BORDER 4X12 (GAUZE/BANDAGES/DRESSINGS) ×4 IMPLANT
ELECT CAUTERY BLADE 6.4 (BLADE) IMPLANT
ELECT REM PT RETURN 9FT ADLT (ELECTROSURGICAL) ×4
ELECTRODE REM PT RTRN 9FT ADLT (ELECTROSURGICAL) ×2 IMPLANT
GAUZE SPONGE 4X4 12PLY STRL (GAUZE/BANDAGES/DRESSINGS) ×4 IMPLANT
GLOVE BIO SURGEON STRL SZ7.5 (GLOVE) ×12 IMPLANT
GLOVE BIO SURGEON STRL SZ8 (GLOVE) ×4 IMPLANT
GLOVE BIOGEL PI IND STRL 7.5 (GLOVE) ×2 IMPLANT
GLOVE BIOGEL PI IND STRL 8 (GLOVE) ×2 IMPLANT
GLOVE BIOGEL PI INDICATOR 7.5 (GLOVE) ×2
GLOVE BIOGEL PI INDICATOR 8 (GLOVE) ×2
GOWN STRL REUS W/ TWL LRG LVL3 (GOWN DISPOSABLE) ×4 IMPLANT
GOWN STRL REUS W/ TWL XL LVL3 (GOWN DISPOSABLE) ×4 IMPLANT
GOWN STRL REUS W/TWL LRG LVL3 (GOWN DISPOSABLE) ×4
GOWN STRL REUS W/TWL XL LVL3 (GOWN DISPOSABLE) ×4
HANDPIECE INTERPULSE COAX TIP (DISPOSABLE)
KIT BASIN OR (CUSTOM PROCEDURE TRAY) ×4 IMPLANT
KIT ROOM TURNOVER OR (KITS) ×4 IMPLANT
MANIFOLD NEPTUNE II (INSTRUMENTS) ×4 IMPLANT
NS IRRIG 1000ML POUR BTL (IV SOLUTION) ×4 IMPLANT
PACK ORTHO EXTREMITY (CUSTOM PROCEDURE TRAY) ×4 IMPLANT
PAD ARMBOARD 7.5X6 YLW CONV (MISCELLANEOUS) ×8 IMPLANT
PADDING CAST ABS 4INX4YD NS (CAST SUPPLIES) ×2
PADDING CAST ABS 6INX4YD NS (CAST SUPPLIES) ×2
PADDING CAST ABS COTTON 4X4 ST (CAST SUPPLIES) ×2 IMPLANT
PADDING CAST ABS COTTON 6X4 NS (CAST SUPPLIES) ×2 IMPLANT
PADDING CAST COTTON 6X4 STRL (CAST SUPPLIES) ×4 IMPLANT
SET HNDPC FAN SPRY TIP SCT (DISPOSABLE) IMPLANT
SPONGE LAP 18X18 X RAY DECT (DISPOSABLE) ×8 IMPLANT
STOCKINETTE IMPERVIOUS 9X36 MD (GAUZE/BANDAGES/DRESSINGS) ×4 IMPLANT
STOCKINETTE IMPERVIOUS LG (DRAPES) ×4 IMPLANT
SUT ETHILON 2 0 PSLX (SUTURE) ×8 IMPLANT
SUT PDS AB 2-0 CT1 27 (SUTURE) IMPLANT
SUT VIC AB 0 CT1 27 (SUTURE) ×6
SUT VIC AB 0 CT1 27XBRD ANBCTR (SUTURE) ×6 IMPLANT
SUT VIC AB 1 CT1 18XCR BRD 8 (SUTURE) ×2 IMPLANT
SUT VIC AB 1 CT1 27 (SUTURE) ×4
SUT VIC AB 1 CT1 27XBRD ANBCTR (SUTURE) ×4 IMPLANT
SUT VIC AB 1 CT1 8-18 (SUTURE) ×2
SUT VIC AB 2-0 CT1 27 (SUTURE) ×4
SUT VIC AB 2-0 CT1 TAPERPNT 27 (SUTURE) ×4 IMPLANT
TOWEL OR 17X24 6PK STRL BLUE (TOWEL DISPOSABLE) ×4 IMPLANT
TOWEL OR 17X26 10 PK STRL BLUE (TOWEL DISPOSABLE) ×8 IMPLANT
TUBE ANAEROBIC SPECIMEN COL (MISCELLANEOUS) IMPLANT
TUBE CONNECTING 12'X1/4 (SUCTIONS) ×1
TUBE CONNECTING 12X1/4 (SUCTIONS) ×3 IMPLANT
UNDERPAD 30X30 INCONTINENT (UNDERPADS AND DIAPERS) ×4 IMPLANT
WATER STERILE IRR 1000ML POUR (IV SOLUTION) ×4 IMPLANT
YANKAUER SUCT BULB TIP NO VENT (SUCTIONS) ×4 IMPLANT
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
## 2015-09-28 NOTE — Transfer of Care (Signed)
Immediate Anesthesia Transfer of Care Note  Patient: Guy Medina  Procedure(s) Performed: Procedure(s): IRRIGATION AND DEBRIDEMENT EXTREMITY (Right) DEBRIDEMENT AND CLOSURE WOUND (Right)  Patient Location: PACU  Anesthesia Type:General  Level of Consciousness: awake, alert  and patient cooperative  Airway & Oxygen Therapy: Patient Spontanous Breathing and Patient connected to face mask oxygen  Post-op Assessment: Report given to RN and Post -op Vital signs reviewed and stable  Post vital signs: Reviewed and stable  Last Vitals:  Vitals:   09/28/15 1247 09/28/15 1500  BP: (!) 98/47 107/64  Pulse: (!) 103 97  Resp: 18 18  Temp:  37.1 C    Last Pain:  Vitals:   09/28/15 1853  TempSrc:   PainSc: 4       Patients Stated Pain Goal: 0 (09/28/15 1257)  Complications: No apparent anesthesia complications
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
## 2015-09-28 NOTE — Progress Notes (Signed)
Pt placed on telemetry; Verified with NT and confirmed with CCMD. Will continue to closely monitor. Dionne Bucy RN

## 2015-09-28 NOTE — Progress Notes (Signed)
Pt transported off unit to OR for procedure. P. Amo Kingson Lohmeyer RN 

## 2015-09-28 NOTE — Progress Notes (Signed)
Since patient arrival patient has been turned multiple times and repositioned to attempt to get his back comfortable.

## 2015-09-28 NOTE — Progress Notes (Signed)
Late entry: Per Dr. Randa Evens ok to give Oxy- Ir 5 mg

## 2015-09-28 NOTE — Progress Notes (Signed)
OT Cancellation Note  Patient Details Name: Guy Medina MRN: [id number] DOB: 1980-08-15   Cancelled Treatment:    Reason Eval/Treat Not Completed: Pain limiting ability to participate. Pt requesting therapy return later, just got pain meds. Will follow.  Evern Bio 09/28/2015, 9:01 AM  [phone]

## 2015-09-28 NOTE — Progress Notes (Signed)
PT Cancellation Note  Patient Details Name: Guy Medina MRN: [id number] DOB: [birth year]   Cancelled Treatment:    Reason Eval/Treat Not Completed: Patient declined, no reason specified; Pt stated that he just received pain meds and requested that therapist return later. PT will f/u with pt as appropriate.   Alessandra Bevels Sheniya Garciaperez 09/28/2015, 9:04 AM Deborah Chalk, PT, DPT [phone]

## 2015-09-28 NOTE — Anesthesia Postprocedure Evaluation (Signed)
Anesthesia Post Note  Patient: Guy Medina  Procedure(s) Performed: Procedure(s) (LRB): IRRIGATION AND DEBRIDEMENT EXTREMITY (Right) DEBRIDEMENT AND CLOSURE WOUND (Right)  Patient location during evaluation: PACU Anesthesia Type: General Level of consciousness: awake Pain management: pain level controlled Vital Signs Assessment: post-procedure vital signs reviewed and stable Respiratory status: spontaneous breathing Cardiovascular status: stable Anesthetic complications: no    Last Vitals:  Vitals:   09/28/15 2110 09/28/15 2125  BP: 131/82 116/74  Pulse: (!) 119 (!) 118  Resp: 17 16  Temp:      Last Pain:  Vitals:   09/28/15 2055  TempSrc:   PainSc: Asleep                 EDWARDS,Marcas Bowsher

## 2015-09-28 NOTE — Anesthesia Preprocedure Evaluation (Addendum)
Anesthesia Evaluation  Patient identified by MRN, date of birth, ID band Patient awake  General Assessment Comment:Chart reviewed. Pt examined. CE  Reviewed: Allergy & Precautions, NPO status   History of Anesthesia Complications Negative for: history of anesthetic complications  Airway Mallampati: II  TM Distance: >3 FB Neck ROM: Full   Comment: Full beard Dental  (+) Teeth Intact   Pulmonary neg pulmonary ROS, former smoker,    breath sounds clear to auscultation       Cardiovascular negative cardio ROS   Rhythm:Regular Rate:Normal     Neuro/Psych    GI/Hepatic negative GI ROS, Neg liver ROS,   Endo/Other  negative endocrine ROSMorbid obesity  Renal/GU negative Renal ROS     Musculoskeletal Femur fracture   Abdominal (+) + obese,   Peds  Hematology   Anesthesia Other Findings   Reproductive/Obstetrics                            Anesthesia Physical Anesthesia Plan  ASA: II  Anesthesia Plan: General   Post-op Pain Management:    Induction: Intravenous  Airway Management Planned: Oral ETT and Video Laryngoscope Planned  Additional Equipment:   Intra-op Plan:   Post-operative Plan: Extubation in OR  Informed Consent: I have reviewed the patients History and Physical, chart, labs and discussed the procedure including the risks, benefits and alternatives for the proposed anesthesia with the patient or authorized representative who has indicated his/her understanding and acceptance.     Plan Discussed with: CRNA and Anesthesiologist  Anesthesia Plan Comments:        Anesthesia Quick Evaluation
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
## 2015-09-28 NOTE — Anesthesia Procedure Notes (Signed)
Procedure Name: Intubation Date/Time: 09/28/2015 1:14 PM Performed by: Faustino Congress Chanda Laperle Pre-anesthesia Checklist: Patient identified, Emergency Drugs available, Suction available and Patient being monitored Patient Re-evaluated:Patient Re-evaluated prior to inductionOxygen Delivery Method: Circle System Utilized Preoxygenation: Pre-oxygenation with 100% oxygen Intubation Type: IV induction Laryngoscope Size: Glidescope and 3 Grade View: Grade I Tube type: Oral Tube size: 8.0 mm Number of attempts: 1 Airway Equipment and Method: Stylet Placement Confirmation: ETT inserted through vocal cords under direct vision,  positive ETCO2 and breath sounds checked- equal and bilateral Secured at: 24 cm Tube secured with: Tape Dental Injury: Teeth and Oropharynx as per pre-operative assessment

## 2015-09-29 ENCOUNTER — Encounter (HOSPITAL_COMMUNITY): Payer: Self-pay | Admitting: Orthopedic Surgery

## 2015-09-29 LAB — CBC
HCT: 22.7 % — ABNORMAL LOW (ref 39.0–52.0)
HEMOGLOBIN: 7.3 g/dL — AB (ref 13.0–17.0)
MCH: 29.1 pg (ref 26.0–34.0)
MCHC: 32.2 g/dL (ref 30.0–36.0)
MCV: 90.4 fL (ref 78.0–100.0)
Platelets: 132 10*3/uL — ABNORMAL LOW (ref 150–400)
RBC: 2.51 MIL/uL — ABNORMAL LOW (ref 4.22–5.81)
RDW: 13.9 % (ref 11.5–15.5)
WBC: 11.7 10*3/uL — AB (ref 4.0–10.5)

## 2015-09-29 LAB — PTH, INTACT AND CALCIUM
Calcium, Total (PTH): 7.7 mg/dL — ABNORMAL LOW (ref 8.7–10.2)
PTH: 53 pg/mL (ref 15–65)

## 2015-09-29 LAB — BASIC METABOLIC PANEL
ANION GAP: 7 (ref 5–15)
BUN: 6 mg/dL (ref 6–20)
CALCIUM: 7.9 mg/dL — AB (ref 8.9–10.3)
CHLORIDE: 101 mmol/L (ref 101–111)
CO2: 27 mmol/L (ref 22–32)
CREATININE: 0.79 mg/dL (ref 0.61–1.24)
GFR calc non Af Amer: >60 mL/min (ref >60)
Glucose, Bld: 143 mg/dL — ABNORMAL HIGH (ref 65–99)
Potassium: 3.7 mmol/L (ref 3.5–5.1)
SODIUM: 135 mmol/L (ref 135–145)

## 2015-09-29 LAB — HEMOGLOBIN A1C
HEMOGLOBIN A1C: 5.3 % (ref 4.8–5.6)
Mean Plasma Glucose: 105 mg/dL

## 2015-09-29 LAB — RAPID URINE DRUG SCREEN, HOSP PERFORMED
Amphetamines: NOT DETECTED
Barbiturates: NOT DETECTED
Benzodiazepines: POSITIVE — AB
Cocaine: NOT DETECTED
OPIATES: POSITIVE — AB
TETRAHYDROCANNABINOL: NOT DETECTED

## 2015-09-29 LAB — VITAMIN D 25 HYDROXY (VIT D DEFICIENCY, FRACTURES): Vit D, 25-Hydroxy: 9.2 ng/mL — ABNORMAL LOW (ref 30.0–100.0)

## 2015-09-29 MED ORDER — HYDROCODONE-ACETAMINOPHEN 10-325 MG PO TABS
1.0000 | ORAL_TABLET | Freq: Four times a day (QID) | ORAL | Status: DC | PRN
  Administered 2015-09-29 – 2015-09-30 (×3): 2 via ORAL
  Filled 2015-09-29 (×4): qty 2

## 2015-09-29 MED ORDER — CEFAZOLIN SODIUM-DEXTROSE 2-4 GM/100ML-% IV SOLN
2.0000 g | Freq: Three times a day (TID) | INTRAVENOUS | Status: AC
  Administered 2015-09-29 (×3): 2 g via INTRAVENOUS
  Filled 2015-09-29 (×3): qty 100

## 2015-09-29 MED ORDER — K PHOS MONO-SOD PHOS DI & MONO 155-852-130 MG PO TABS
500.0000 mg | ORAL_TABLET | Freq: Three times a day (TID) | ORAL | Status: DC
  Administered 2015-09-29 – 2015-09-30 (×3): 500 mg via ORAL
  Filled 2015-09-29 (×4): qty 2

## 2015-09-29 MED ORDER — VITAMIN D 1000 UNITS PO TABS
2000.0000 [IU] | ORAL_TABLET | Freq: Two times a day (BID) | ORAL | Status: DC
  Administered 2015-09-29 – 2015-09-30 (×4): 2000 [IU] via ORAL
  Filled 2015-09-29 (×3): qty 2

## 2015-09-29 MED ORDER — CALCIUM CITRATE 950 (200 CA) MG PO TABS
200.0000 mg | ORAL_TABLET | Freq: Two times a day (BID) | ORAL | Status: DC
  Administered 2015-09-29 – 2015-09-30 (×2): 200 mg via ORAL
  Filled 2015-09-29 (×4): qty 1
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
## 2015-09-29 NOTE — Progress Notes (Signed)
Orthopedic Tech Progress Note Patient Details:  Guy Medina 11-May-1980 [id number]  Patient ID: Guy Medina, male   DOB: 16-Apr-1980, 35 y.o.   MRN: [id number]   Guy Medina 09/29/2015, 8:56 AM Called [employer] for right Hinged knee brace,

## 2015-09-29 NOTE — Progress Notes (Addendum)
Orthopaedic Trauma Service (OTS)  Subjective: 1 Day Post-Op Procedure(s) (LRB): IRRIGATION AND DEBRIDEMENT EXTREMITY (Right) DEBRIDEMENT AND CLOSURE WOUND (Right) Patient reports pain as mild and much improved. Eager for PT.  Objective: Current Vitals Blood pressure (!) 102/48, pulse (!) 104, temperature 98.9 F (37.2 C), temperature source Oral, resp. rate 18, height 6' (1.829 m), weight (!) 149.7 kg (330 lb), SpO2 99 %. Vital signs in last 24 hours: Temp:  [97 F (36.1 C)-99.6 F (37.6 C)] 98.9 F (37.2 C) (07/25 0948) Pulse Rate:  [97-119] 104 (07/25 0349) Resp:  [16-18] 18 (07/25 0349) BP: (98-131)/(47-82) 102/48 (07/25 0349) SpO2:  [96 %-100 %] 99 % (07/25 0349)  Intake/Output from previous day: 07/24 0701 - 07/25 0700 In: 2608.8 [I.V.:2558.8; IV Piggyback:50] Out: 1895 [Urine:1725; Drains:150; Blood:20]  LABS  Recent Labs  09/26/15 1426 09/26/15 1428 09/27/15 0534 09/28/15 0353 09/29/15 0910  HGB 14.3 13.7 10.5* 8.3* 7.3*    Recent Labs  09/28/15 0353 09/29/15 0910  WBC 10.5 11.7*  RBC 2.85* 2.51*  HCT 26.0* 22.7*  PLT 119* 132*    Recent Labs  09/28/15 0353 09/28/15 0914 09/29/15 0910  NA 135  --  135  K 3.9  --  3.7  CL 103  --  101  CO2 27  --  27  BUN 5*  --  6  CREATININE 0.79  --  0.79  GLUCOSE 121*  --  143*  CALCIUM 7.7* 7.7* 7.9*    Recent Labs  09/26/15 1427  INR 1.05   Physical Exam RLE Dressing intact, clean, dry  Edema/ swelling controlled  Sens: DPN, SPN, TN intact  Motor: EHL, FHL, and lessor toe ext and flex all intact grossly  Brisk cap refill, warm to touch  Imaging No results found.  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 Spoke with Dr. Yevette Edwards who reviewed T12 fracture and will treat nonop without bracing.  Should he fail to mobilize sufficiently, bracing could  be used for symptomatic relief. 2. NWB on RLE with AROM, PROM of knee and PT assistance 3. Lovenox 4. Recheck H&H 5. Testosterone level  Myrene Galas, MD [facility] [phone] (p)  09/29/2015, 10:47 AM
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
## 2015-09-30 ENCOUNTER — Encounter (HOSPITAL_COMMUNITY): Payer: Self-pay | Admitting: Orthopedic Surgery

## 2015-09-30 DIAGNOSIS — E559 Vitamin D deficiency, unspecified: Secondary | ICD-10-CM | POA: Diagnosis present

## 2015-09-30 DIAGNOSIS — T79A21A Traumatic compartment syndrome of right lower extremity, initial encounter: Secondary | ICD-10-CM

## 2015-09-30 DIAGNOSIS — S82141A Displaced bicondylar fracture of right tibia, initial encounter for closed fracture: Secondary | ICD-10-CM

## 2015-09-30 DIAGNOSIS — S22089A Unspecified fracture of T11-T12 vertebra, initial encounter for closed fracture: Secondary | ICD-10-CM | POA: Diagnosis present

## 2015-09-30 HISTORY — DX: Traumatic compartment syndrome of right lower extremity, initial encounter: T79.A21A

## 2015-09-30 HISTORY — DX: Vitamin D deficiency, unspecified: E55.9

## 2015-09-30 HISTORY — DX: Displaced bicondylar fracture of right tibia, initial encounter for closed fracture: S82.141A

## 2015-09-30 LAB — CBC
HEMATOCRIT: 23.5 % — AB (ref 39.0–52.0)
HEMOGLOBIN: 7.4 g/dL — AB (ref 13.0–17.0)
MCH: 28.6 pg (ref 26.0–34.0)
MCHC: 31.5 g/dL (ref 30.0–36.0)
MCV: 90.7 fL (ref 78.0–100.0)
Platelets: 164 10*3/uL (ref 150–400)
RBC: 2.59 MIL/uL — ABNORMAL LOW (ref 4.22–5.81)
RDW: 14.1 % (ref 11.5–15.5)
WBC: 16.1 10*3/uL — AB (ref 4.0–10.5)

## 2015-09-30 LAB — CALCITRIOL (1,25 DI-OH VIT D): Vit D, 1,25-Dihydroxy: 28.4 pg/mL (ref 19.9–79.3)

## 2015-09-30 LAB — PHOSPHORUS: Phosphorus: 2.5 mg/dL (ref 2.5–4.6)

## 2015-09-30 MED ORDER — CHOLECALCIFEROL 50 MCG (2000 UT) PO TABS
2000.0000 [IU] | ORAL_TABLET | Freq: Two times a day (BID) | ORAL | 4 refills | Status: DC
Start: 2015-09-30 — End: 2016-04-22

## 2015-09-30 MED ORDER — METHOCARBAMOL 500 MG PO TABS: 1000.0000 mg | ORAL_TABLET | Freq: Four times a day (QID) | ORAL | 0 refills | Status: DC

## 2015-09-30 MED ORDER — HYDROCODONE-ACETAMINOPHEN 10-325 MG PO TABS: 1.0000 | ORAL_TABLET | Freq: Four times a day (QID) | ORAL | 0 refills | Status: DC | PRN

## 2015-09-30 MED ORDER — ENOXAPARIN SODIUM 40 MG/0.4ML ~~LOC~~ SOLN: 40.0000 mg | SUBCUTANEOUS | 0 refills | Status: DC

## 2015-09-30 MED ORDER — DOCUSATE SODIUM 100 MG PO CAPS: 100.0000 mg | ORAL_CAPSULE | Freq: Two times a day (BID) | ORAL | 0 refills | Status: DC

## 2015-09-30 MED ORDER — K PHOS MONO-SOD PHOS DI & MONO 155-852-130 MG PO TABS
500.0000 mg | ORAL_TABLET | Freq: Two times a day (BID) | ORAL | Status: DC
  Filled 2015-09-30 (×2): qty 2

## 2015-09-30 MED ORDER — CALCIUM CITRATE 950 (200 CA) MG PO TABS: 200.0000 mg | ORAL_TABLET | Freq: Two times a day (BID) | ORAL | 2 refills | Status: DC

## 2015-09-30 MED ORDER — ENOXAPARIN (LOVENOX) PATIENT EDUCATION KIT
PACK | Freq: Once | Status: DC
  Filled 2015-09-30: qty 1

## 2015-09-30 MED ORDER — OXYCODONE HCL 5 MG PO TABS: 5.0000 mg | ORAL_TABLET | Freq: Four times a day (QID) | ORAL | 0 refills | Status: DC | PRN
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
## 2015-09-30 NOTE — Progress Notes (Signed)
OT Cancellation Note  Patient Details Name: Guy Medina MRN: [id number] DOB: 11-Nov-1980   Cancelled Treatment:    Reason Eval/Treat Not Completed: Other (comment).  Pt seen just coming back from PT and very sleepy.  Family member present and states she is familiar with tub bench and how to use it. She feels they can wait for [facility] for AE.  Will sign off from acute as they do not have any other questions/concerns at this time.  Hamid Brookens 09/30/2015, 11:47 AM  Marica Otter, OTR/L [phone] 09/30/2015

## 2015-09-30 NOTE — Progress Notes (Signed)
All discharge information reviewed with pt. Discharge rx given to pt. All belongings with pt. Pt in no distress at this of discharge.

## 2015-10-01 LAB — SEX HORMONE BINDING GLOBULIN: Sex Hormone Binding: 35.6 nmol/L (ref 16.5–55.9)

## 2015-10-01 LAB — LUTEINIZING HORMONE: LH: 1.5 m[IU]/mL — AB (ref 1.7–8.6)

## 2015-10-01 LAB — FOLLICLE STIMULATING HORMONE: FSH: 1 m[IU]/mL — ABNORMAL LOW (ref 1.5–12.4)

## 2015-10-01 LAB — TESTOSTERONE: Testosterone: 24 ng/dL — ABNORMAL LOW (ref 264–916)
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
## 2015-10-06 LAB — TESTOSTERONE, FREE: TESTOSTERONE FREE: 0.7 pg/mL — AB (ref 8.7–25.1)

## 2015-10-06 LAB — TESTOSTERONE, % FREE: TESTOSTERONE-% FREE: 1 % — AB (ref 0.2–0.7)

## 2015-10-15 ENCOUNTER — Other Ambulatory Visit: Payer: Self-pay | Admitting: Orthopedic Surgery

## 2015-10-15 DIAGNOSIS — E559 Vitamin D deficiency, unspecified: Secondary | ICD-10-CM

## 2015-10-20 ENCOUNTER — Ambulatory Visit
Admission: RE | Admit: 2015-10-20 | Discharge: 2015-10-20 | Disposition: A | Discharge status: Home / Self Care | Payer: 59 | Source: Ambulatory Visit | Attending: Orthopedic Surgery | Admitting: Orthopedic Surgery

## 2015-10-20 DIAGNOSIS — E559 Vitamin D deficiency, unspecified: Secondary | ICD-10-CM

## 2015-12-28 ENCOUNTER — Other Ambulatory Visit: Payer: Self-pay | Admitting: Internal Medicine

## 2015-12-28 DIAGNOSIS — E221 Hyperprolactinemia: Secondary | ICD-10-CM

## 2016-01-06 ENCOUNTER — Other Ambulatory Visit: Payer: 59

## 2016-03-30 ENCOUNTER — Other Ambulatory Visit: Payer: Self-pay | Admitting: Orthopedic Surgery

## 2016-03-30 DIAGNOSIS — M79604 Pain in right leg: Secondary | ICD-10-CM

## 2016-03-30 DIAGNOSIS — S82111D Displaced fracture of right tibial spine, subsequent encounter for closed fracture with routine healing: Secondary | ICD-10-CM | POA: Diagnosis not present

## 2016-03-30 DIAGNOSIS — S72461D Displaced supracondylar fracture with intracondylar extension of lower end of right femur, subsequent encounter for closed fracture with routine healing: Secondary | ICD-10-CM | POA: Diagnosis not present

## 2016-03-30 DIAGNOSIS — R609 Edema, unspecified: Secondary | ICD-10-CM

## 2016-03-31 ENCOUNTER — Ambulatory Visit
Admission: RE | Admit: 2016-03-31 | Discharge: 2016-03-31 | Disposition: A | Discharge status: Home / Self Care | Payer: 59 | Source: Ambulatory Visit | Attending: Orthopedic Surgery | Admitting: Orthopedic Surgery

## 2016-03-31 ENCOUNTER — Other Ambulatory Visit: Payer: Self-pay | Admitting: *Deleted

## 2016-03-31 ENCOUNTER — Telehealth: Payer: Self-pay | Admitting: Surgery

## 2016-03-31 DIAGNOSIS — R609 Edema, unspecified: Secondary | ICD-10-CM

## 2016-03-31 DIAGNOSIS — M7989 Other specified soft tissue disorders: Secondary | ICD-10-CM | POA: Diagnosis not present

## 2016-03-31 DIAGNOSIS — R600 Localized edema: Secondary | ICD-10-CM

## 2016-03-31 DIAGNOSIS — M79604 Pain in right leg: Secondary | ICD-10-CM

## 2016-03-31 NOTE — Telephone Encounter (Signed)
-----   Message from Sharee PimpleMarilyn K McChesney, RN sent at 03/31/2016 10:00 AM EST ----- Regarding: schedule asap    ----- Message ----- From: Nada LibmanVance W Brabham, MD Sent: 03/30/2016   5:28 PM To: [facility] Charge Pool  Please get this patient of Dr. Carola FrostHandy in soon to see anyone for post op LE edema.  He will neeed a venous reflux study prior.

## 2016-03-31 NOTE — Telephone Encounter (Signed)
Spoke to pt, will be here 1/30 for additional US and OV with PA

## 2016-04-01 DIAGNOSIS — R269 Unspecified abnormalities of gait and mobility: Secondary | ICD-10-CM | POA: Diagnosis not present

## 2016-04-01 DIAGNOSIS — S72409A Unspecified fracture of lower end of unspecified femur, initial encounter for closed fracture: Secondary | ICD-10-CM | POA: Diagnosis not present

## 2016-04-05 ENCOUNTER — Ambulatory Visit (INDEPENDENT_AMBULATORY_CARE_PROVIDER_SITE_OTHER): Payer: Self-pay | Admitting: Physician Assistant

## 2016-04-05 ENCOUNTER — Ambulatory Visit (HOSPITAL_COMMUNITY)
Admission: RE | Admit: 2016-04-05 | Discharge: 2016-04-05 | Disposition: A | Discharge status: Home / Self Care | Payer: 59 | Source: Ambulatory Visit | Attending: Vascular Surgery | Admitting: Vascular Surgery

## 2016-04-05 VITALS — BP 132/87 | HR 87 | Temp 98.7°F | Resp 20 | Ht 72.0 in | Wt 331.0 lb

## 2016-04-05 DIAGNOSIS — I868 Varicose veins of other specified sites: Secondary | ICD-10-CM

## 2016-04-05 DIAGNOSIS — R600 Localized edema: Secondary | ICD-10-CM | POA: Diagnosis not present

## 2016-04-05 DIAGNOSIS — I872 Venous insufficiency (chronic) (peripheral): Secondary | ICD-10-CM
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
## 2016-04-07 ENCOUNTER — Other Ambulatory Visit: Payer: Self-pay

## 2016-04-27 ENCOUNTER — Encounter (HOSPITAL_COMMUNITY)
Admission: RE | Disposition: A | Discharge status: Home / Self Care | Payer: Self-pay | Source: Ambulatory Visit | Attending: Vascular Surgery

## 2016-04-27 ENCOUNTER — Ambulatory Visit (HOSPITAL_COMMUNITY)
Admission: RE | Admit: 2016-04-27 | Discharge: 2016-04-27 | Disposition: A | Discharge status: Home / Self Care | Payer: 59 | Source: Ambulatory Visit | Attending: Vascular Surgery | Admitting: Vascular Surgery

## 2016-04-27 ENCOUNTER — Encounter (HOSPITAL_COMMUNITY): Payer: Self-pay | Admitting: Vascular Surgery

## 2016-04-27 ENCOUNTER — Telehealth: Payer: Self-pay | Admitting: Vascular Surgery

## 2016-04-27 ENCOUNTER — Other Ambulatory Visit: Payer: Self-pay | Admitting: *Deleted

## 2016-04-27 DIAGNOSIS — I82519 Chronic embolism and thrombosis of unspecified femoral vein: Secondary | ICD-10-CM

## 2016-04-27 DIAGNOSIS — M25561 Pain in right knee: Secondary | ICD-10-CM | POA: Diagnosis not present

## 2016-04-27 DIAGNOSIS — Z87891 Personal history of nicotine dependence: Secondary | ICD-10-CM | POA: Insufficient documentation

## 2016-04-27 DIAGNOSIS — Z48812 Encounter for surgical aftercare following surgery on the circulatory system: Secondary | ICD-10-CM

## 2016-04-27 DIAGNOSIS — I82511 Chronic embolism and thrombosis of right femoral vein: Secondary | ICD-10-CM | POA: Diagnosis not present

## 2016-04-27 DIAGNOSIS — R600 Localized edema: Secondary | ICD-10-CM | POA: Diagnosis present

## 2016-04-27 DIAGNOSIS — E559 Vitamin D deficiency, unspecified: Secondary | ICD-10-CM | POA: Diagnosis not present

## 2016-04-27 HISTORY — PX: LOWER EXTREMITY VENOGRAPHY: CATH118253

## 2016-04-27 LAB — POCT I-STAT, CHEM 8
BUN: 16 mg/dL (ref 6–20)
CALCIUM ION: 1.12 mmol/L — AB (ref 1.15–1.40)
CHLORIDE: 103 mmol/L (ref 101–111)
CREATININE: 0.9 mg/dL (ref 0.61–1.24)
GLUCOSE: 91 mg/dL (ref 65–99)
HCT: 43 % (ref 39.0–52.0)
Hemoglobin: 14.6 g/dL (ref 13.0–17.0)
Potassium: 4.1 mmol/L (ref 3.5–5.1)
Sodium: 140 mmol/L (ref 135–145)
TCO2: 29 mmol/L (ref 0–100)

## 2016-04-27 LAB — POCT ACTIVATED CLOTTING TIME: Activated Clotting Time: 169 seconds

## 2016-04-27 SURGERY — LOWER EXTREMITY VENOGRAPHY
Anesthesia: LOCAL

## 2016-04-27 MED ORDER — SODIUM CHLORIDE 0.9 % IV SOLN
INTRAVENOUS | Status: DC
  Administered 2016-04-27: 09:00:00 via INTRAVENOUS

## 2016-04-27 MED ORDER — ASPIRIN EC 81 MG PO TBEC: 81.0000 mg | DELAYED_RELEASE_TABLET | Freq: Every day | ORAL | Status: AC

## 2016-04-27 MED ORDER — ASPIRIN EC 325 MG PO TBEC
325.0000 mg | DELAYED_RELEASE_TABLET | Freq: Every day | ORAL | Status: DC
  Administered 2016-04-27: 325 mg via ORAL

## 2016-04-27 MED ORDER — IODIXANOL 320 MG/ML IV SOLN
INTRAVENOUS | Status: DC | PRN
  Administered 2016-04-27: 70 mL via INTRAVENOUS

## 2016-04-27 MED ORDER — HEPARIN (PORCINE) IN NACL 2-0.9 UNIT/ML-% IJ SOLN
INTRAMUSCULAR | Status: DC | PRN
  Administered 2016-04-27: 1000 mL

## 2016-04-27 MED ORDER — APIXABAN 5 MG PO TABS: 10.0000 mg | ORAL_TABLET | Freq: Two times a day (BID) | ORAL | 0 refills | Status: DC

## 2016-04-27 MED ORDER — FENTANYL CITRATE (PF) 100 MCG/2ML IJ SOLN
INTRAMUSCULAR | Status: DC | PRN
  Administered 2016-04-27: 50 ug via INTRAVENOUS
  Administered 2016-04-27: 100 ug via INTRAVENOUS
  Administered 2016-04-27: 50 ug via INTRAVENOUS

## 2016-04-27 MED ORDER — FENTANYL CITRATE (PF) 100 MCG/2ML IJ SOLN
INTRAMUSCULAR | Status: AC
  Filled 2016-04-27: qty 2

## 2016-04-27 MED ORDER — SODIUM CHLORIDE 0.9 % IV SOLN: 1.0000 mL/kg/h | INTRAVENOUS | Status: DC

## 2016-04-27 MED ORDER — APIXABAN 5 MG PO TABS: 5.0000 mg | ORAL_TABLET | Freq: Two times a day (BID) | ORAL | 3 refills | Status: DC

## 2016-04-27 MED ORDER — ASPIRIN EC 325 MG PO TBEC
DELAYED_RELEASE_TABLET | ORAL | Status: AC
  Administered 2016-04-27: 325 mg via ORAL
  Filled 2016-04-27: qty 1

## 2016-04-27 MED ORDER — LIDOCAINE HCL (PF) 1 % IJ SOLN
INTRAMUSCULAR | Status: AC
  Filled 2016-04-27: qty 30

## 2016-04-27 MED ORDER — MIDAZOLAM HCL 2 MG/2ML IJ SOLN
INTRAMUSCULAR | Status: AC
  Filled 2016-04-27: qty 2

## 2016-04-27 MED ORDER — MORPHINE SULFATE (PF) 4 MG/ML IV SOLN: 2.0000 mg | INTRAVENOUS | Status: DC | PRN

## 2016-04-27 MED ORDER — APIXABAN 5 MG PO TABS: 5.0000 mg | ORAL_TABLET | Freq: Two times a day (BID) | ORAL | Status: DC

## 2016-04-27 MED ORDER — OXYCODONE-ACETAMINOPHEN 5-325 MG PO TABS: 1.0000 | ORAL_TABLET | ORAL | Status: DC | PRN

## 2016-04-27 MED ORDER — APIXABAN 5 MG PO TABS
10.0000 mg | ORAL_TABLET | Freq: Two times a day (BID) | ORAL | Status: DC
  Administered 2016-04-27: 10 mg via ORAL
  Filled 2016-04-27 (×3): qty 2

## 2016-04-27 MED ORDER — HEPARIN (PORCINE) IN NACL 2-0.9 UNIT/ML-% IJ SOLN
INTRAMUSCULAR | Status: AC
  Filled 2016-04-27: qty 1000

## 2016-04-27 MED ORDER — MIDAZOLAM HCL 2 MG/2ML IJ SOLN
INTRAMUSCULAR | Status: DC | PRN
  Administered 2016-04-27 (×4): 1 mg via INTRAVENOUS

## 2016-04-27 MED ORDER — HEPARIN SODIUM (PORCINE) 1000 UNIT/ML IJ SOLN
INTRAMUSCULAR | Status: DC | PRN
  Administered 2016-04-27: 10000 [IU] via INTRAVENOUS

## 2016-04-27 SURGICAL SUPPLY — 27 items
BALLN MUSTANG 12X60X75 (BALLOONS) ×2
BALLN MUSTANG 5X150X135 (BALLOONS) ×2
BALLN MUSTANG 8X80X75 (BALLOONS) ×2
BALLOON MUSTANG 12X60X75 (BALLOONS) ×1 IMPLANT
BALLOON MUSTANG 5X150X135 (BALLOONS) ×1 IMPLANT
BALLOON MUSTANG 8X80X75 (BALLOONS) ×1 IMPLANT
CATH ANGIO 5F BER2 65CM (CATHETERS) ×2 IMPLANT
CATH NAVICROSS ANG 65CM (CATHETERS) ×1 IMPLANT
CATH TEMPO AQUA 5F 100CM (CATHETERS) ×2 IMPLANT
CATH VISIONS PV .035 IVUS (CATHETERS) ×2 IMPLANT
CATHETER NAVICROSS ANG 65CM (CATHETERS) ×2
COVER PRB 48X5XTLSCP FOLD TPE (BAG) ×1 IMPLANT
COVER PROBE 5X48 (BAG) ×1
DEVICE TORQUE .025-.038 (MISCELLANEOUS) ×2 IMPLANT
GUIDEWIRE ANGLED .035X150CM (WIRE) ×2 IMPLANT
KIT ENCORE 26 ADVANTAGE (KITS) ×2 IMPLANT
KIT PV (KITS) ×2 IMPLANT
SHEATH DESTINATION MP 5FR 45CM (SHEATH) ×2 IMPLANT
SHEATH PINNACLE 5F 10CM (SHEATH) ×2 IMPLANT
SHEATH PINNACLE 8F 10CM (SHEATH) ×4 IMPLANT
SYR MEDRAD MARK V 150ML (SYRINGE) ×2 IMPLANT
TRANSDUCER W/STOPCOCK (MISCELLANEOUS) ×2 IMPLANT
TRAY PV CATH (CUSTOM PROCEDURE TRAY) ×2 IMPLANT
WIRE AMPLATZ SS-J .035X180CM (WIRE) ×2 IMPLANT
WIRE AMPLATZ SS-J .035X260CM (WIRE) ×2 IMPLANT
WIRE BENTSON .035X145CM (WIRE) ×2 IMPLANT
WIRE MINI STICK MAX (SHEATH) ×2 IMPLANT

## 2016-04-27 NOTE — Discharge Instructions (Signed)
Venogram, Care After °Refer to this sheet in the next few weeks. These instructions provide you with information on caring for yourself after your procedure. Your health care provider may also give you more specific instructions. Your treatment has been planned according to current medical practices, but problems sometimes occur. Call your health care provider if you have any problems or questions after your procedure. °WHAT TO EXPECT AFTER THE PROCEDURE °After your procedure, it is typical to have the following sensations: °· Mild discomfort at the catheter insertion site. °HOME CARE INSTRUCTIONS  °· Take all medicines exactly as directed. °· Follow any prescribed diet. °· Follow instructions regarding both rest and physical activity. °· Drink more fluids for the first several days after the procedure in order to help flush dye from your kidneys. °SEEK MEDICAL CARE IF: °· You develop a rash. °· You have fever not controlled by medicine. °SEEK IMMEDIATE MEDICAL CARE IF: °· There is pain, drainage, bleeding, redness, swelling, warmth or a red streak at the site of the IV tube. °· The extremity where your IV tube was placed becomes discolored, numb, or cool. °· You have difficulty breathing or shortness of breath. °· You develop chest pain. °· You have excessive dizziness or fainting. °This information is not intended to replace advice given to you by your health care provider. Make sure you discuss any questions you have with your health care provider. °Document Released: 12/12/2012 Document Revised: 02/26/2013 Document Reviewed: 12/12/2012 °Elsevier Interactive Patient Education © 2017 [employer]vier Inc[employer] ° °

## 2016-04-27 NOTE — Telephone Encounter (Signed)
-----   Message from Sharee PimpleMarilyn K McChesney, RN sent at 04/27/2016  2:43 PM EST ----- Regarding: postop 4 weeks Schedule vein study as the IVC/ iliac vein study not the VV reflux, confusing right?  ----- Message ----- From: Maeola HarmanBrandon Demian Cain, MD Sent: 04/27/2016  12:51 PM To: [facility] Charge [address]Pool  Gwenyth BenderChristopher H Edling [id number] 10/15/1980  04/27/2016 Pre-operative Diagnosis: right lower extremity edema Post-operative diagnosis:  Occluded right femoral venous system, post thrombotic syndrome Surgeon:  Luanna SalkBrandon C. Randie Heinzain, MD Procedure Performed: 1.  US guided cannulation of right popliteal vein x 2 2.  Ascending venogram of right lower extremity 3.  Intravascular ultrasound of right popliteal, femoral, common femoral, external iliac and common ilac veins 4.  Balloon venoplasty of right popliteal and femoral veins with 5, 8 and 12mm balloons 5.  Moderate sedation with fentanyl and versed for 112 minutes   F/u in 4 weeks with right lower extremity reflux study
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
## 2016-04-27 NOTE — Telephone Encounter (Signed)
Scheduled reflux study on 3/19 and office visit on 3/23.

## 2016-04-28 ENCOUNTER — Telehealth: Payer: Self-pay

## 2016-04-28 MED FILL — Lidocaine HCl Local Preservative Free (PF) Inj 1%: INTRAMUSCULAR | Qty: 30 | Status: AC

## 2016-04-28 NOTE — Telephone Encounter (Signed)
Notified pt. of Dr. Randie Heinzain stating no contraindications to [employer].  Pt. also asked about an "occasional" beer or glass of wine.  Advised that he should avoid routine use of beer or wine, but a glass of either, occasionally, should not be a problem.  Encouraged him to discuss further with Dr. Randie Heinzain, at his f/u appt.  Verb. Understanding.

## 2016-04-28 NOTE — Telephone Encounter (Signed)
-----   Message from Maeola HarmanBrandon Chawn Cain, MD sent at 04/28/2016 11:20 AM EST ----- Regarding: RE: Question  I don't know of any contraindications to monster drinks other than taste.   brandon ----- Message ----- From: Phillips Odorarol S Melroy Bougher, RN Sent: 04/28/2016  10:17 AM To: Maeola HarmanBrandon Jarious Cain, MD Subject: Question                                       S/p Balloon Venoplasty (R) Pop/ Femoral Veins 04/27/16. This may be a crazy question.  Do you know if there would be a contraindication with pt. on anticoagulant and drinking "Monster" drinks?  He left a message about this.  He works night shift, and apparently routinely uses [employer].  Should I defer his question to his pharmacist?

## 2016-05-02 ENCOUNTER — Other Ambulatory Visit: Payer: Self-pay

## 2016-05-02 DIAGNOSIS — R269 Unspecified abnormalities of gait and mobility: Secondary | ICD-10-CM | POA: Diagnosis not present

## 2016-05-02 DIAGNOSIS — S72409A Unspecified fracture of lower end of unspecified femur, initial encounter for closed fracture: Secondary | ICD-10-CM | POA: Diagnosis not present

## 2016-05-02 NOTE — Addendum Note (Signed)
Addended by: Burton ApleyPETTY, Illiana Losurdo A on: 05/02/2016 12:00 PM   Modules accepted: Orders

## 2016-05-05 ENCOUNTER — Other Ambulatory Visit: Payer: Self-pay | Admitting: Physician Assistant

## 2016-05-11 DIAGNOSIS — S72461D Displaced supracondylar fracture with intracondylar extension of lower end of right femur, subsequent encounter for closed fracture with routine healing: Secondary | ICD-10-CM | POA: Diagnosis not present

## 2016-05-16 ENCOUNTER — Encounter: Payer: Self-pay | Admitting: Vascular Surgery

## 2016-05-23 ENCOUNTER — Ambulatory Visit (HOSPITAL_COMMUNITY)
Admission: RE | Admit: 2016-05-23 | Discharge: 2016-05-23 | Disposition: A | Discharge status: Home / Self Care | Payer: 59 | Source: Ambulatory Visit | Attending: Surgery | Admitting: Surgery

## 2016-05-23 DIAGNOSIS — I872 Venous insufficiency (chronic) (peripheral): Secondary | ICD-10-CM | POA: Diagnosis not present

## 2016-05-27 ENCOUNTER — Encounter: Payer: Self-pay | Admitting: Vascular Surgery

## 2016-05-27 ENCOUNTER — Ambulatory Visit (INDEPENDENT_AMBULATORY_CARE_PROVIDER_SITE_OTHER): Payer: Self-pay | Admitting: Vascular Surgery

## 2016-05-27 VITALS — BP 131/92 | HR 73 | Temp 98.8°F | Resp 20 | Ht 72.0 in | Wt 330.6 lb

## 2016-05-27 DIAGNOSIS — I872 Venous insufficiency (chronic) (peripheral): Secondary | ICD-10-CM
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
## 2016-05-30 DIAGNOSIS — S72409A Unspecified fracture of lower end of unspecified femur, initial encounter for closed fracture: Secondary | ICD-10-CM | POA: Diagnosis not present

## 2016-05-30 DIAGNOSIS — R269 Unspecified abnormalities of gait and mobility: Secondary | ICD-10-CM | POA: Diagnosis not present

## 2016-06-01 NOTE — Addendum Note (Signed)
Addended by: Burton ApleyPETTY, Laruth Hanger A on: 06/01/2016 09:10 AM   Modules accepted: Orders

## 2016-06-30 DIAGNOSIS — S72409A Unspecified fracture of lower end of unspecified femur, initial encounter for closed fracture: Secondary | ICD-10-CM | POA: Diagnosis not present

## 2016-06-30 DIAGNOSIS — R269 Unspecified abnormalities of gait and mobility: Secondary | ICD-10-CM | POA: Diagnosis not present

## 2016-07-05 ENCOUNTER — Ambulatory Visit: Payer: 59 | Admitting: Vascular Surgery

## 2016-07-07 ENCOUNTER — Encounter (HOSPITAL_COMMUNITY): Payer: Self-pay | Admitting: Vascular Surgery

## 2016-08-25 ENCOUNTER — Encounter: Payer: Self-pay | Admitting: Vascular Surgery

## 2016-09-03 ENCOUNTER — Other Ambulatory Visit: Payer: Self-pay | Admitting: Physician Assistant

## 2016-09-05 ENCOUNTER — Encounter: Payer: Self-pay | Admitting: Vascular Surgery

## 2016-09-09 ENCOUNTER — Encounter: Payer: Self-pay | Admitting: Vascular Surgery

## 2016-09-09 ENCOUNTER — Ambulatory Visit (INDEPENDENT_AMBULATORY_CARE_PROVIDER_SITE_OTHER): Payer: 59 | Admitting: Vascular Surgery

## 2016-09-09 ENCOUNTER — Ambulatory Visit (HOSPITAL_COMMUNITY)
Admission: RE | Admit: 2016-09-09 | Discharge: 2016-09-09 | Disposition: A | Discharge status: Home / Self Care | Payer: 59 | Source: Ambulatory Visit | Attending: Vascular Surgery | Admitting: Vascular Surgery

## 2016-09-09 VITALS — BP 132/85 | HR 83 | Temp 97.7°F | Resp 20 | Ht 72.0 in | Wt 328.0 lb

## 2016-09-09 DIAGNOSIS — I872 Venous insufficiency (chronic) (peripheral): Secondary | ICD-10-CM
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
## 2016-09-30 NOTE — Addendum Note (Signed)
Addended by: Koula Venier A on: 09/30/2016 04:12 PM   Modules accepted: Orders  

## 2017-01-10 ENCOUNTER — Other Ambulatory Visit: Payer: Self-pay | Admitting: Vascular Surgery

## 2017-05-09 ENCOUNTER — Other Ambulatory Visit: Payer: Self-pay | Admitting: Vascular Surgery

## 2017-06-15 DIAGNOSIS — Z8249 Family history of ischemic heart disease and other diseases of the circulatory system: Secondary | ICD-10-CM | POA: Diagnosis not present

## 2017-06-15 DIAGNOSIS — J209 Acute bronchitis, unspecified: Secondary | ICD-10-CM | POA: Diagnosis not present

## 2017-06-29 ENCOUNTER — Encounter (INDEPENDENT_AMBULATORY_CARE_PROVIDER_SITE_OTHER): Payer: 59

## 2017-06-29 DIAGNOSIS — I868 Varicose veins of other specified sites: Secondary | ICD-10-CM

## 2017-09-07 ENCOUNTER — Other Ambulatory Visit: Payer: Self-pay | Admitting: Vascular Surgery
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
## 2017-09-13 IMAGING — RF DG FEMUR 2+V*R*
1 series · 11 of 11 positions shown · non-contrast
Comparison: Right femur radiographs dated 09/26/2015 at 9119 hours

FLUOROSCOPY TIME:  2 minutes 11 seconds

CLINICAL DATA: ORIF right femur

EXAM:
RIGHT FEMUR 2 VIEWS; DG C-ARM GT 120 MIN

[Series 1: run · 11 of 11 slices shown]
[im 1/11]
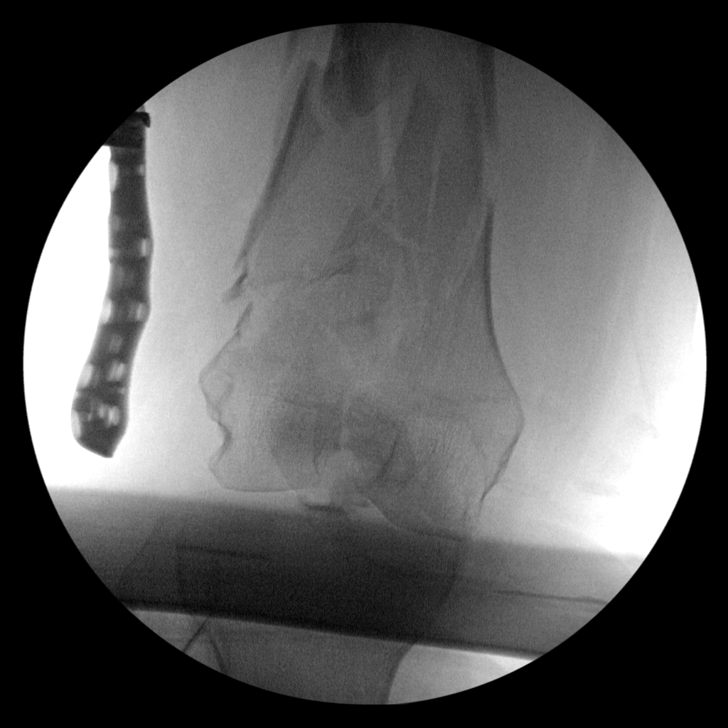
[im 2/11]
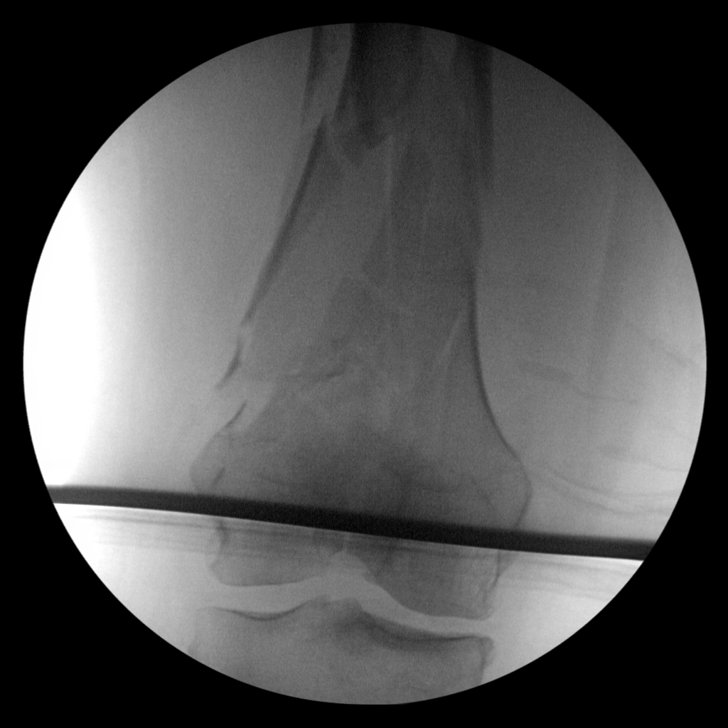
[im 3/11]
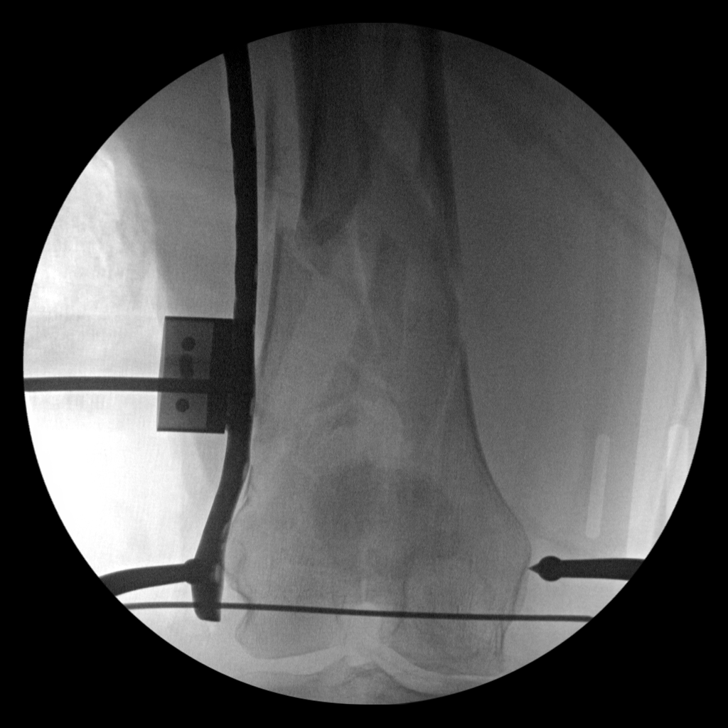
[im 4/11]
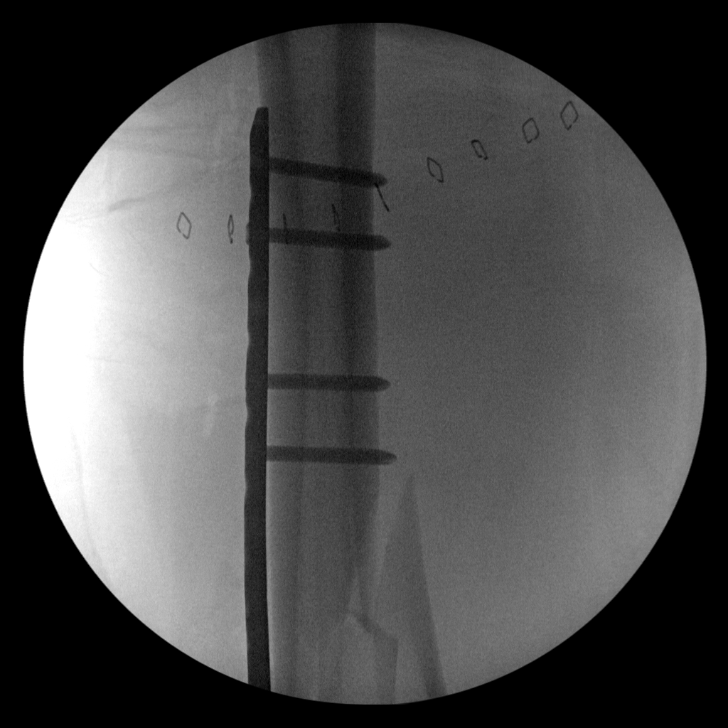
[im 5/11]
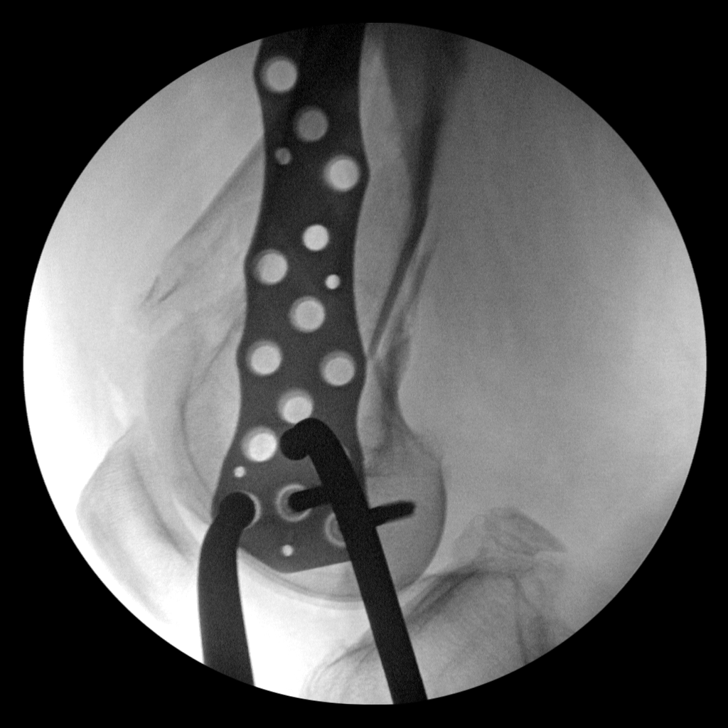
[im 6/11]
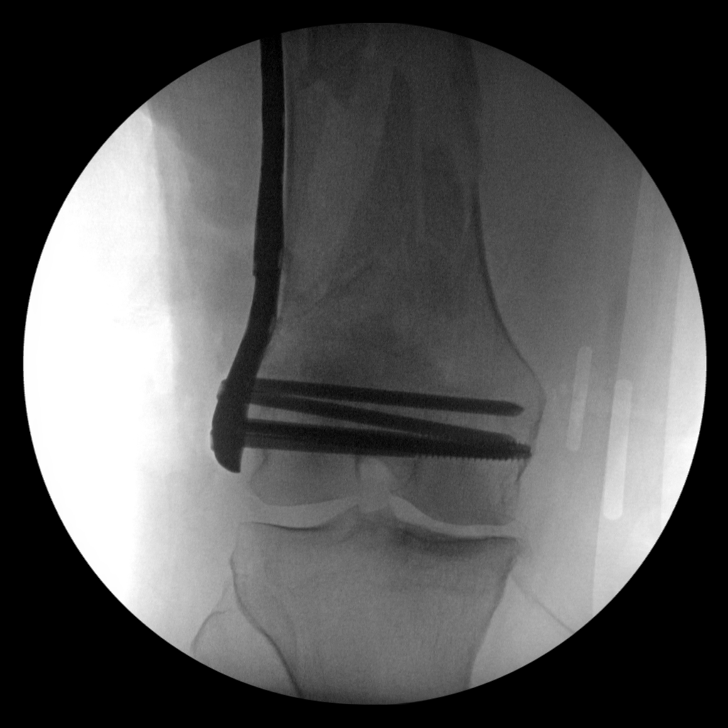
[im 7/11]
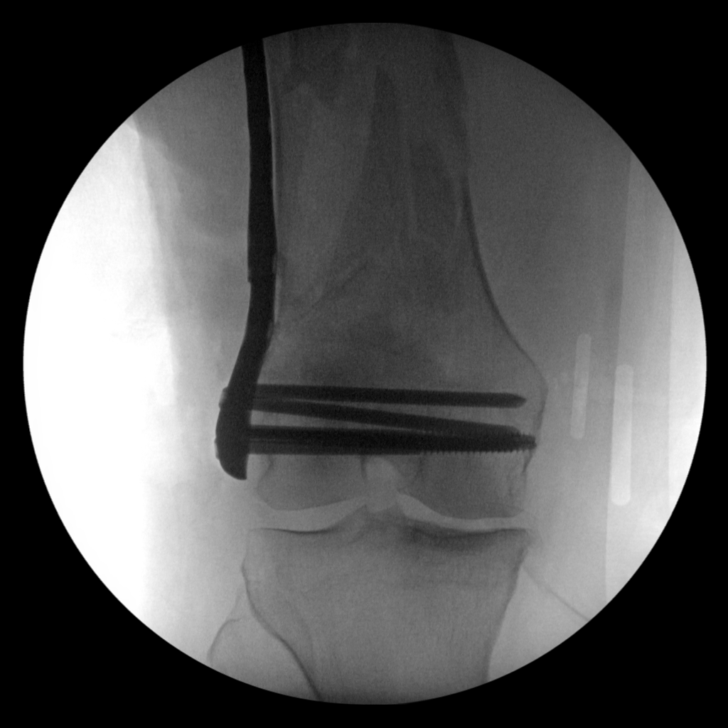
[im 8/11]
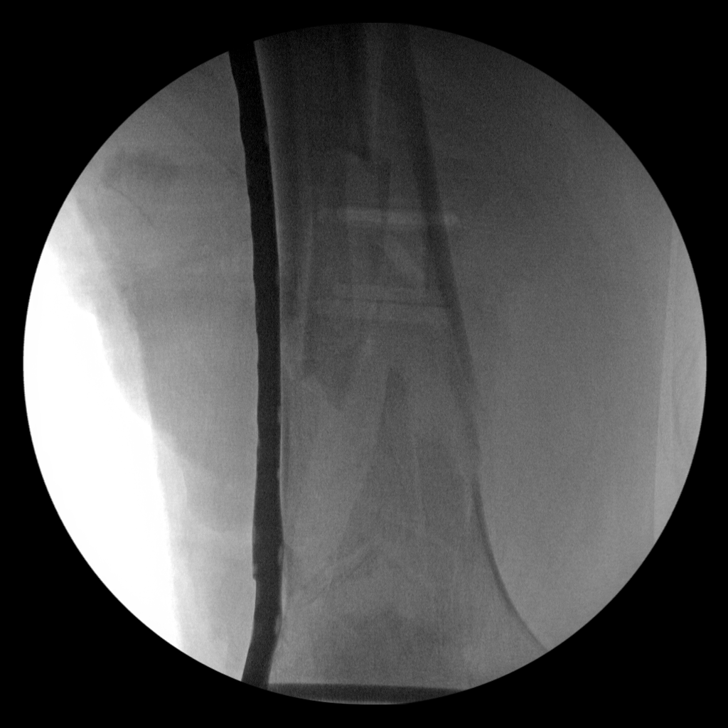
[im 9/11]
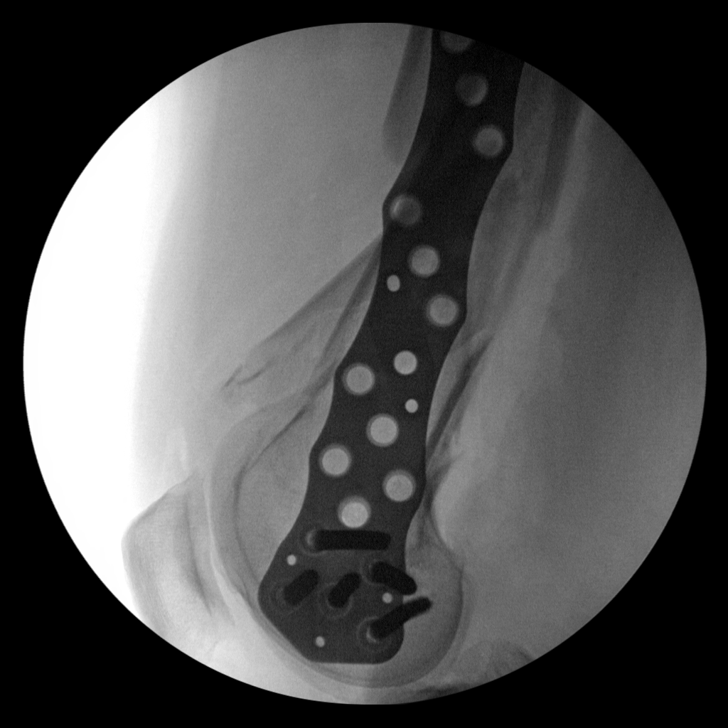
[im 10/11]
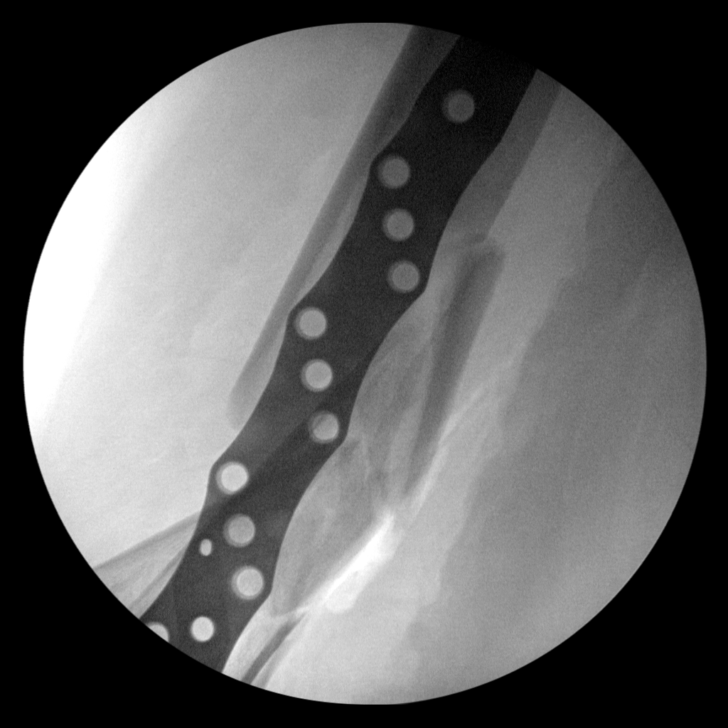
[im 11/11]
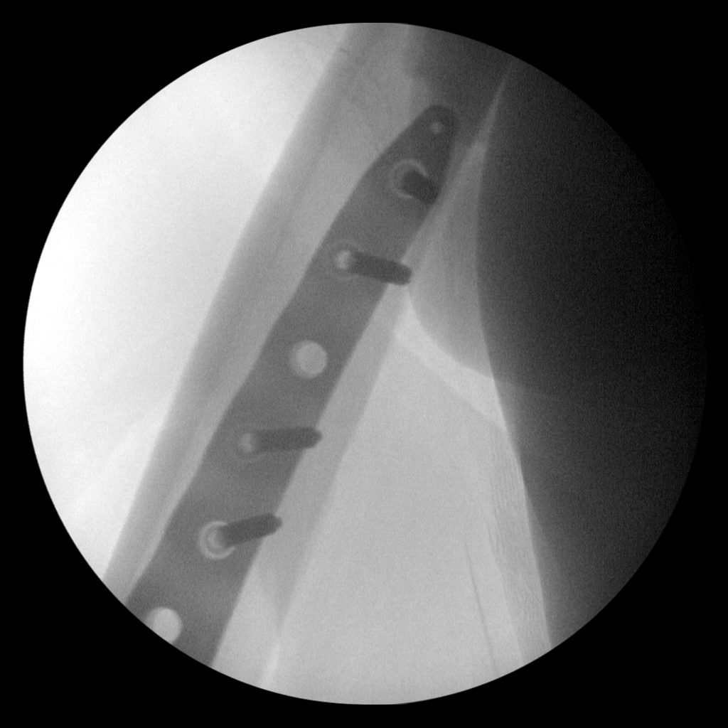

[11 of 11 positions shown; findings below may reference images not displayed]

FINDINGS: Intraoperative fluoroscopic images during lateral compression plate
and screw fixation of a comminuted distal femoral shaft fracture.

Improved alignment status post ORIF.
IMPRESSION: Intraoperative fluoroscopic radiographs during ORIF of a comminuted
distal femur fracture.

## 2017-09-15 ENCOUNTER — Encounter: Payer: Self-pay | Admitting: Vascular Surgery

## 2017-09-15 ENCOUNTER — Ambulatory Visit (HOSPITAL_COMMUNITY)
Admission: RE | Admit: 2017-09-15 | Discharge: 2017-09-15 | Disposition: A | Discharge status: Home / Self Care | Payer: 59 | Source: Ambulatory Visit | Attending: Vascular Surgery | Admitting: Vascular Surgery

## 2017-09-15 ENCOUNTER — Other Ambulatory Visit: Payer: Self-pay

## 2017-09-15 ENCOUNTER — Ambulatory Visit (INDEPENDENT_AMBULATORY_CARE_PROVIDER_SITE_OTHER): Payer: 59 | Admitting: Vascular Surgery

## 2017-09-15 VITALS — BP 126/78 | HR 69 | Temp 99.4°F | Resp 20 | Ht 72.0 in | Wt 329.0 lb

## 2017-09-15 DIAGNOSIS — I82519 Chronic embolism and thrombosis of unspecified femoral vein: Secondary | ICD-10-CM

## 2017-09-15 DIAGNOSIS — I872 Venous insufficiency (chronic) (peripheral): Secondary | ICD-10-CM

## 2017-09-15 DIAGNOSIS — Z9889 Other specified postprocedural states: Secondary | ICD-10-CM | POA: Diagnosis not present

## 2017-09-15 DIAGNOSIS — I868 Varicose veins of other specified sites: Secondary | ICD-10-CM
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
## 2018-01-16 ENCOUNTER — Other Ambulatory Visit: Payer: Self-pay | Admitting: Vascular Surgery

## 2018-05-09 ENCOUNTER — Other Ambulatory Visit: Payer: Self-pay | Admitting: Vascular Surgery

## 2018-10-09 ENCOUNTER — Other Ambulatory Visit: Payer: Self-pay | Admitting: Vascular Surgery

## 2018-11-01 ENCOUNTER — Other Ambulatory Visit: Payer: Self-pay

## 2018-11-01 DIAGNOSIS — I82519 Chronic embolism and thrombosis of unspecified femoral vein: Secondary | ICD-10-CM

## 2018-11-02 ENCOUNTER — Encounter: Payer: Self-pay | Admitting: Vascular Surgery

## 2018-11-02 ENCOUNTER — Other Ambulatory Visit: Payer: Self-pay

## 2018-11-02 ENCOUNTER — Ambulatory Visit (HOSPITAL_COMMUNITY)
Admission: RE | Admit: 2018-11-02 | Discharge: 2018-11-02 | Disposition: A | Discharge status: Home / Self Care | Payer: 59 | Source: Ambulatory Visit | Attending: Vascular Surgery | Admitting: Vascular Surgery

## 2018-11-02 ENCOUNTER — Ambulatory Visit: Payer: 59 | Admitting: Vascular Surgery

## 2018-11-02 ENCOUNTER — Ambulatory Visit (INDEPENDENT_AMBULATORY_CARE_PROVIDER_SITE_OTHER): Payer: 59 | Admitting: Vascular Surgery

## 2018-11-02 VITALS — BP 107/73 | HR 65 | Temp 97.9°F | Resp 20 | Ht 72.0 in | Wt 317.0 lb

## 2018-11-02 DIAGNOSIS — I83813 Varicose veins of bilateral lower extremities with pain: Secondary | ICD-10-CM

## 2018-11-02 DIAGNOSIS — I82511 Chronic embolism and thrombosis of right femoral vein: Secondary | ICD-10-CM | POA: Diagnosis not present

## 2018-11-02 DIAGNOSIS — I82519 Chronic embolism and thrombosis of unspecified femoral vein: Secondary | ICD-10-CM

## 2018-11-02 DIAGNOSIS — I872 Venous insufficiency (chronic) (peripheral): Secondary | ICD-10-CM

## 2018-11-02 NOTE — Progress Notes (Signed)
Patient ID: Guy BenderChristopher H Harriger, male   DOB: 09/08/1980, 38 y.o.   MRN: [id number]  Reason for Consult: Follow-up   Referred by No ref. provider found  Subjective:     HPI:  Guy Medina is a 38 y.o. male previous history of balloon angioplasty of his right femoral vein.  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  Does wear his compression stockings religiously currently 15 to 20 mmHg.  Takes Eliquis and aspirin daily.  He is on a program to afford his Eliquis which is going well.  He continues to work daily.  He walks without limitation.  Really has no complaints today.  Prior to his procedure he had skin changes with rashes.  Past Medical History:  Diagnosis Date  . Closed fracture of right distal femur (HCC) 09/26/2015  . Compartment syndrome of right lower extremity (HCC), thigh 09/30/2015  . Fracture of right tibial plateau 09/30/2015  . Right knee meniscal tear    In his 20s  . Vitamin D deficiency 09/30/2015   History reviewed. No pertinent family history. Past Surgical History:  Procedure Laterality Date  . DEBRIDEMENT AND CLOSURE WOUND Right 09/28/2015   Procedure: DEBRIDEMENT AND CLOSURE WOUND;  Surgeon: Myrene GalasMichael Handy, MD;  [facility];  Service: Orthopedics;  Laterality: Right;  . I&D EXTREMITY Right 09/28/2015   Procedure: IRRIGATION AND DEBRIDEMENT EXTREMITY;  Surgeon: Myrene GalasMichael Handy, MD;  [facility];  Service: Orthopedics;  Laterality: Right;  . KNEE SURGERY     Right knee surgery for meniscal injury in his 1220s  . LOWER EXTREMITY VENOGRAPHY N/A 04/27/2016   Procedure: Lower Extremity Venography;  Surgeon: Maeola HarmanBrandon Gavinn Kawehi Hostetter, MD;  [facility] LAB;  Service: Cardiovascular;  Laterality: N/A;  . ORIF FEMUR FRACTURE Right 09/28/2015   OPEN REDUCTION INTERNAL FIXATION (ORIF) DISTAL FEMUR FRACTURE (Right) WITH INTERCONDYLAR EXTENSION  . ORIF FEMUR FRACTURE Right 09/26/2015   Procedure:  OPEN REDUCTION INTERNAL FIXATION (ORIF) DISTAL FEMUR FRACTURE;  Surgeon: Myrene GalasMichael Handy, MD;  [facility];  Service: Orthopedics;  Laterality: Right;    Short Social History:  Social History   Tobacco Use  . Smoking status: Former Games developermoker  . Smokeless tobacco: Never Used  . Tobacco comment: quit smoking years ago   Substance Use Topics  . Alcohol use: Yes    Alcohol/week: 0.0 standard drinks    Comment: Social drinker    No Known Allergies  Current Outpatient Medications  Medication Sig Dispense Refill  . aspirin EC 81 MG tablet Take 1 tablet (81 mg total) by mouth daily.    Marland Kitchen. ELIQUIS 5 MG TABS tablet TAKE 1 TABLET BY MOUTH TWICE A DAY 60 tablet 4  . naproxen sodium (ANAPROX) 220 MG tablet Take 440 mg by mouth 2 (two) times daily as needed (pain).      No current facility-administered medications for this visit.     Review of Systems  Constitutional:  Constitutional negative. HENT: HENT negative.  Eyes: Eyes negative.  Respiratory: Respiratory negative.  Cardiovascular: Positive for leg swelling.  GI: Gastrointestinal negative.  Musculoskeletal: Musculoskeletal negative.  Skin: Skin negative.  Neurological: Neurological negative. Hematologic: Hematologic/lymphatic negative.  Psychiatric: Psychiatric negative.        Objective:  Objective   Vitals:   11/02/18 1138  BP: 107/73  Pulse: 65  Resp: 20  Temp: 97.9 F (36.6 C)  SpO2: 98%  Weight: (!) 317 lb (143.8 kg)  Height: 6' (1.829 m)  Body mass index is 42.99 kg/m.  Physical Exam HENT:     Head: Normocephalic.     Nose: Nose normal.  Eyes:     Pupils: Pupils are equal, round, and reactive to light.  Cardiovascular:     Rate and Rhythm: Normal rate.  Abdominal:     General: Abdomen is flat.     Palpations: Abdomen is soft.  Musculoskeletal: Normal range of motion.     Comments: Right leg approximately 10% larger than left without pitting edema  Skin:    General: Skin is warm and dry.      Capillary Refill: Capillary refill takes less than 2 seconds.     Findings: No rash.  Neurological:     General: No focal deficit present.     Mental Status: He is alert.  Psychiatric:        Mood and Affect: Mood normal.        Behavior: Behavior normal.        Thought Content: Thought content normal.        Judgment: Judgment normal.     Data: Identified and interpreted his lower extremity venous study which demonstrates full compressibility with common femoral vein as well as saphenofemoral junction.  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  There is no spontaneous fluid present with distal augmentation.     Assessment/Plan:     38 year old male status post balloon angioplasty right femoral vein.  Continues to do very well with much improved swelling and no skin changes as he previously had.  Wears his stockings religiously taking Eliquis.  We will have him follow-up in 1 year with repeat reflux testing as he does have a large refluxing saphenous vein from previous measurements.  Certainly we could consider ablation of this but have always been somewhat reluctant given the lack of flow in his femoral vein.  He has issues prior to 1 year we can see him sooner.     Waynetta Sandy MD [facility]

## 2019-02-22 ENCOUNTER — Other Ambulatory Visit: Payer: Self-pay | Admitting: Vascular Surgery

## 2019-08-06 ENCOUNTER — Other Ambulatory Visit: Payer: Self-pay | Admitting: Vascular Surgery

## 2019-12-06 ENCOUNTER — Other Ambulatory Visit: Payer: Self-pay | Admitting: *Deleted

## 2019-12-06 DIAGNOSIS — M25569 Pain in unspecified knee: Secondary | ICD-10-CM

## 2019-12-06 DIAGNOSIS — I872 Venous insufficiency (chronic) (peripheral): Secondary | ICD-10-CM

## 2019-12-27 ENCOUNTER — Encounter (HOSPITAL_COMMUNITY): Payer: 59

## 2019-12-27 ENCOUNTER — Ambulatory Visit: Payer: 59 | Admitting: Vascular Surgery

## 2020-01-12 ENCOUNTER — Other Ambulatory Visit: Payer: Self-pay | Admitting: Vascular Surgery

## 2020-01-27 ENCOUNTER — Other Ambulatory Visit: Payer: Self-pay | Admitting: *Deleted

## 2020-01-27 DIAGNOSIS — I872 Venous insufficiency (chronic) (peripheral): Secondary | ICD-10-CM

## 2020-02-07 ENCOUNTER — Ambulatory Visit (HOSPITAL_COMMUNITY)
Admission: RE | Admit: 2020-02-07 | Discharge: 2020-02-07 | Disposition: A | Discharge status: Home / Self Care | Payer: 59 | Source: Ambulatory Visit | Attending: Vascular Surgery | Admitting: Vascular Surgery

## 2020-02-07 ENCOUNTER — Ambulatory Visit (INDEPENDENT_AMBULATORY_CARE_PROVIDER_SITE_OTHER): Payer: 59 | Admitting: Vascular Surgery

## 2020-02-07 ENCOUNTER — Other Ambulatory Visit: Payer: Self-pay

## 2020-02-07 ENCOUNTER — Encounter: Payer: Self-pay | Admitting: Vascular Surgery

## 2020-02-07 VITALS — BP 122/83 | HR 77 | Temp 98.1°F | Resp 20 | Ht 72.0 in | Wt 335.0 lb

## 2020-02-07 DIAGNOSIS — I82519 Chronic embolism and thrombosis of unspecified femoral vein: Secondary | ICD-10-CM

## 2020-02-07 DIAGNOSIS — I872 Venous insufficiency (chronic) (peripheral): Secondary | ICD-10-CM | POA: Diagnosis not present
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
## 2020-06-25 ENCOUNTER — Other Ambulatory Visit: Payer: Self-pay | Admitting: Vascular Surgery

## 2020-11-29 ENCOUNTER — Other Ambulatory Visit: Payer: Self-pay | Admitting: Vascular Surgery

## 2021-01-16 ENCOUNTER — Other Ambulatory Visit: Payer: Self-pay

## 2021-01-16 DIAGNOSIS — I872 Venous insufficiency (chronic) (peripheral): Secondary | ICD-10-CM

## 2021-02-10 ENCOUNTER — Encounter: Payer: Self-pay | Admitting: Vascular Surgery

## 2021-02-10 ENCOUNTER — Ambulatory Visit (INDEPENDENT_AMBULATORY_CARE_PROVIDER_SITE_OTHER): Payer: 59 | Admitting: Vascular Surgery

## 2021-02-10 ENCOUNTER — Ambulatory Visit (HOSPITAL_COMMUNITY)
Admission: RE | Admit: 2021-02-10 | Discharge: 2021-02-10 | Disposition: A | Discharge status: Home / Self Care | Payer: 59 | Source: Ambulatory Visit | Attending: Vascular Surgery | Admitting: Vascular Surgery

## 2021-02-10 ENCOUNTER — Other Ambulatory Visit: Payer: Self-pay

## 2021-02-10 VITALS — BP 133/84 | HR 95 | Temp 98.1°F | Resp 20 | Ht 72.0 in | Wt 352.0 lb

## 2021-02-10 DIAGNOSIS — I872 Venous insufficiency (chronic) (peripheral): Secondary | ICD-10-CM | POA: Insufficient documentation
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
## 2021-04-30 ENCOUNTER — Other Ambulatory Visit: Payer: Self-pay | Admitting: Vascular Surgery

## 2021-12-03 ENCOUNTER — Other Ambulatory Visit: Payer: Self-pay

## 2021-12-03 MED ORDER — APIXABAN 5 MG PO TABS: 5.0000 mg | ORAL_TABLET | Freq: Two times a day (BID) | ORAL | 11 refills | Status: DC

## 2021-12-03 NOTE — Telephone Encounter (Signed)
Pt called requesting new prescription for Eliquis be sent to different pharmacy d/t insurance change.  Reviewed pt's chart, returned call for clarification, two identifiers used. Prescription refilled, sent to [facility] on [address], updated pharmacy info. Confirmed understanding.

## 2021-12-15 ENCOUNTER — Other Ambulatory Visit: Payer: Self-pay | Admitting: Family Medicine

## 2021-12-15 DIAGNOSIS — M545 Low back pain, unspecified: Secondary | ICD-10-CM

## 2021-12-16 ENCOUNTER — Ambulatory Visit
Admission: RE | Admit: 2021-12-16 | Discharge: 2021-12-16 | Disposition: A | Discharge status: Home / Self Care | Payer: BC Managed Care – PPO | Source: Ambulatory Visit | Attending: Family Medicine | Admitting: Family Medicine

## 2021-12-16 DIAGNOSIS — M545 Low back pain, unspecified: Secondary | ICD-10-CM

## 2022-02-15 ENCOUNTER — Other Ambulatory Visit: Payer: Self-pay | Admitting: *Deleted

## 2022-02-15 DIAGNOSIS — I872 Venous insufficiency (chronic) (peripheral): Secondary | ICD-10-CM

## 2022-02-16 ENCOUNTER — Encounter (HOSPITAL_COMMUNITY): Payer: BC Managed Care – PPO

## 2022-02-16 ENCOUNTER — Ambulatory Visit: Payer: BC Managed Care – PPO | Admitting: Vascular Surgery

## 2022-02-23 ENCOUNTER — Ambulatory Visit (HOSPITAL_COMMUNITY)
Admission: RE | Admit: 2022-02-23 | Discharge: 2022-02-23 | Disposition: A | Discharge status: Home / Self Care | Payer: BC Managed Care – PPO | Source: Ambulatory Visit | Attending: Vascular Surgery | Admitting: Vascular Surgery

## 2022-02-23 ENCOUNTER — Ambulatory Visit: Payer: BC Managed Care – PPO | Admitting: Vascular Surgery

## 2022-02-23 ENCOUNTER — Encounter: Payer: Self-pay | Admitting: Vascular Surgery

## 2022-02-23 VITALS — BP 119/81 | HR 71 | Temp 98.2°F | Resp 20 | Ht 72.0 in | Wt 343.0 lb

## 2022-02-23 DIAGNOSIS — I872 Venous insufficiency (chronic) (peripheral): Secondary | ICD-10-CM | POA: Diagnosis present

## 2022-02-23 DIAGNOSIS — I87001 Postthrombotic syndrome without complications of right lower extremity: Secondary | ICD-10-CM | POA: Diagnosis not present

## 2022-02-23 NOTE — Progress Notes (Signed)
Patient ID: Guy Medina, male   DOB: [birth year], 41 y.o.   MRN: [id number]  Reason for Consult: Follow-up   Referred by Johny Blamer, MD  Subjective:     HPI:  Guy Medina is a 41 y.o. male well-known to me with a history of balloon angioplasty of the right femoral vein which was secondary to trauma.  He remains on Eliquis and is wearing thigh-high compression stockings religiously.  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  States that both legs are equal size and has not had any pain and walks without limitation.  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 No pertinent family history. Past Surgical History:  Procedure Laterality Date   DEBRIDEMENT AND CLOSURE WOUND Right 09/28/2015   Procedure: DEBRIDEMENT AND CLOSURE WOUND;  Surgeon: Myrene Galas, MD;  Location: [facility] OR;  Service: Orthopedics;  Laterality: Right;   I & D EXTREMITY Right 09/28/2015   Procedure: IRRIGATION AND DEBRIDEMENT EXTREMITY;  Surgeon: Myrene Galas, MD;  Location: [facility] OR;  Service: Orthopedics;  Laterality: Right;   KNEE SURGERY     Right knee surgery for meniscal injury in his 29s   LOWER EXTREMITY VENOGRAPHY N/A 04/27/2016   Procedure: Lower Extremity Venography;  Surgeon: Maeola Harman, MD;  Location: [facility] INVASIVE CV LAB;  Service: Cardiovascular;  Laterality: N/A;   ORIF FEMUR FRACTURE Right 09/28/2015   OPEN REDUCTION INTERNAL FIXATION (ORIF) DISTAL FEMUR FRACTURE (Right) WITH INTERCONDYLAR EXTENSION   ORIF FEMUR FRACTURE Right 09/26/2015   Procedure: OPEN REDUCTION INTERNAL FIXATION (ORIF) DISTAL FEMUR FRACTURE;  Surgeon: Myrene Galas, MD;  Location: [facility] OR;  Service: Orthopedics;   Laterality: Right;    Short Social History:  Social History   Tobacco Use   Smoking status: Former   Smokeless tobacco: Never   Tobacco comments:    quit smoking years ago   Substance Use Topics   Alcohol use: Yes    Alcohol/week: 0.0 standard drinks of alcohol    Comment: Social drinker    No Known Allergies  Current Outpatient Medications  Medication Sig Dispense Refill   apixaban (ELIQUIS) 5 MG TABS tablet Take 1 tablet (5 mg total) by mouth 2 (two) times daily. 60 tablet 11   aspirin EC 81 MG tablet Take 1 tablet (81 mg total) by mouth daily.     naproxen sodium (ANAPROX) 220 MG tablet Take 440 mg by mouth 2 (two) times daily as needed (pain).  (Patient not taking: Reported on 02/10/2021)     No current facility-administered medications for this visit.    Review of Systems  Constitutional:  Constitutional negative. HENT: HENT negative.  Eyes: Eyes negative.  Respiratory: Respiratory negative.  GI: Gastrointestinal negative.  Musculoskeletal: Musculoskeletal negative.  Neurological: Neurological negative. Hematologic: Hematologic/lymphatic negative.  Psychiatric: Psychiatric negative.        Objective:  Objective   Vitals:   02/23/22 1135  BP: 119/81  Pulse: 71  Resp: 20  Temp: 98.2 F (36.8 C)  SpO2: 95%  Weight: (!) 343 lb (155.6 kg)  Height: 6' (1.829 m)   Body mass index is 46.52 kg/m.  Physical Exam HENT:     Head: Normocephalic.  Nose: Nose normal.  Eyes:     Pupils: Pupils are equal, round, and reactive to light.  Cardiovascular:     Rate and Rhythm: Normal rate.     Pulses: Normal pulses.  Abdominal:     General: Abdomen is flat.  Musculoskeletal:     Comments: Wearing a right-sided compression sock and both legs are equal size today.  Neurological:     General: No focal deficit present.     Mental Status: He is alert.  Psychiatric:        Mood and Affect: Mood normal.        Thought Content: Thought content normal.         Judgment: Judgment normal.     Data: Venous Reflux Times  +--------------+---------+------+-----------+------------+----------------+   RIGHT        Reflux NoRefluxReflux TimeDiameter cmsComments                                  Yes                                            +--------------+---------+------+-----------+------------+----------------+   CFV          no                                                       +--------------+---------+------+-----------+------------+----------------+   FV mid        no                                    chronic  thrombus  +--------------+---------+------+-----------+------------+----------------+   Popliteal    no                                    chronic  thrombus  +--------------+---------+------+-----------+------------+----------------+   GSV at SFJ              yes    >500 ms      1.41                       +--------------+---------+------+-----------+------------+----------------+   GSV prox thighno                            0.90                       +--------------+---------+------+-----------+------------+----------------+   GSV mid thigh no                            0.84                       +--------------+---------+------+-----------+------------+----------------+   GSV dist thigh          yes    >500 ms      0.70                       +--------------+---------+------+-----------+------------+----------------+  GSV at knee             yes    >500 ms      0.79                       +--------------+---------+------+-----------+------------+----------------+   GSV prox calf           yes    >500 ms      0.51                       +--------------+---------+------+-----------+------------+----------------+   GSV mid calf  no                            0.39                        +--------------+---------+------+-----------+------------+----------------+   SSV Pop Fossa no                            0.45                       +--------------+---------+------+-----------+------------+----------------+   SSV prox calf no                            0.38                       +--------------+---------+------+-----------+------------+----------------+   SSV mid calf  no                            0.38                       +--------------+---------+------+-----------+------------+----------------+      Summary:  Right:  - No evidence of acute deep vein thrombosis seen in the right lower  extremity, from the common femoral through the popliteal veins.   -Evidence of chronic deep vein thrombosis involving the mid femoral vein  and poplitea vein.  - No evidence of superficial venous thrombosis in the right lower  extremity.    - The deep venous system is competent.  - The great saphenous vein is incompetent.  - The small saphenous vein is competent.      Assessment/Plan:    41 year old male with history of femoral vein angioplasty for occlusive disease does have reflux in the right greater saphenous vein but he is without symptoms at this time states that his skin is much improved and he really does not have any swelling on physical exam though he is wearing a compression sock which he is religious with.  He will remain on Eliquis and compression stockings indefinitely.  I would be reticent to ever treat his greater saphenous vein but we will follow-up in 2 years unless he has issues before this.     Maeola Harman MD [facility]

## 2022-12-05 ENCOUNTER — Other Ambulatory Visit: Payer: Self-pay | Admitting: Vascular Surgery

## 2023-12-31 ENCOUNTER — Other Ambulatory Visit: Payer: Self-pay | Admitting: Vascular Surgery

## 2024-01-05 ENCOUNTER — Other Ambulatory Visit: Payer: Self-pay

## 2024-01-05 DIAGNOSIS — I872 Venous insufficiency (chronic) (peripheral): Secondary | ICD-10-CM

## 2024-01-27 ENCOUNTER — Other Ambulatory Visit: Payer: Self-pay | Admitting: Vascular Surgery

## 2024-02-14 ENCOUNTER — Ambulatory Visit (HOSPITAL_COMMUNITY): Admission: RE | Admit: 2024-02-14 | Discharge: 2024-02-14 | Attending: Vascular Surgery

## 2024-02-14 DIAGNOSIS — I872 Venous insufficiency (chronic) (peripheral): Secondary | ICD-10-CM | POA: Diagnosis present

## 2024-03-06 ENCOUNTER — Ambulatory Visit: Admitting: Vascular Surgery

## 2024-03-26 ENCOUNTER — Ambulatory Visit: Attending: Vascular Surgery | Admitting: Vascular Surgery

## 2024-03-26 DIAGNOSIS — I87009 Postthrombotic syndrome without complications of unspecified extremity: Secondary | ICD-10-CM | POA: Diagnosis not present

## 2024-03-26 DIAGNOSIS — I872 Venous insufficiency (chronic) (peripheral): Secondary | ICD-10-CM

## 2024-03-26 NOTE — Progress Notes (Signed)
 "    Virtual Visit via Telephone Note  Guy Medina was alone in his car on a Bluetooth headphone and I am alone in the office at the time of our conversation.  Chief Complaint: No complaints today  History of Present Illness:  Guy Medina is a 44 y.o. male with has history of femoral vein angioplasty for occlusive disease and also has known reflux in his right greater saphenous vein.  Procedure was performed approximately 8 years ago.  He has been compliant with thigh-high compression stockings and also takes Eliquis  religiously.  He has recently switched jobs currently working at the [employer] in [place] where he is on his feet frequently but has not had significant symptoms.  We are having a phone visit today due to his work schedule.  Past Medical History:  Diagnosis Date   Closed fracture of right distal femur (HCC) 09/26/2015   Compartment syndrome of right lower extremity (HCC), thigh 09/30/2015   Fracture of right tibial plateau 09/30/2015   Right knee meniscal tear    In his 20s   Vitamin D  deficiency 09/30/2015    Past Surgical History:  Procedure Laterality Date   DEBRIDEMENT AND CLOSURE WOUND Right 09/28/2015   Procedure: DEBRIDEMENT AND CLOSURE WOUND;  Surgeon: Ozell Bruch, MD;  Location: [facility] OR;  Service: Orthopedics;  Laterality: Right;   I & D EXTREMITY Right 09/28/2015   Procedure: IRRIGATION AND DEBRIDEMENT EXTREMITY;  Surgeon: Ozell Bruch, MD;  Location: [facility] OR;  Service: Orthopedics;  Laterality: Right;   KNEE SURGERY     Right knee surgery for meniscal injury in his 43s   LOWER EXTREMITY VENOGRAPHY N/A 04/27/2016   Procedure: Lower Extremity Venography;  Surgeon: Penne Lonni [place], MD;  Location: [facility] INVASIVE CV LAB;  Service: Cardiovascular;  Laterality: N/A;   ORIF FEMUR FRACTURE Right 09/28/2015   OPEN REDUCTION INTERNAL FIXATION (ORIF) DISTAL FEMUR FRACTURE (Right) WITH INTERCONDYLAR EXTENSION   ORIF FEMUR FRACTURE Right 09/26/2015    Procedure: OPEN REDUCTION INTERNAL FIXATION (ORIF) DISTAL FEMUR FRACTURE;  Surgeon: Ozell Bruch, MD;  Location: [facility] OR;  Service: Orthopedics;  Laterality: Right;    Active Medications[1]  12 system ROS was negative unless otherwise noted in HPI   Observations/Objective: Patient demonstrates good understanding of our conversation  Venous Reflux Times  +--------------+---------+------+-----------+------------+----------------+   RIGHT        Reflux NoRefluxReflux TimeDiameter cmsComments                                  Yes                                            +--------------+---------+------+-----------+------------+----------------+   CFV                    yes   >1 second                                +--------------+---------+------+-----------+------------+----------------+   FV mid        no                                    chronic  thrombus  +--------------+---------+------+-----------+------------+----------------+  Popliteal              yes   >1 second             chronic  thrombus  +--------------+---------+------+-----------+------------+----------------+   GSV at [facility]              yes    >500 ms      1.12                       +--------------+---------+------+-----------+------------+----------------+   GSV prox thigh          yes    >500 ms      1.02                       +--------------+---------+------+-----------+------------+----------------+   GSV mid thigh           yes    >500 ms      0.96                       +--------------+---------+------+-----------+------------+----------------+   GSV dist thigh          yes    >500 ms      0.95                       +--------------+---------+------+-----------+------------+----------------+   GSV at knee             yes    >500 ms      0.77                        +--------------+---------+------+-----------+------------+----------------+   GSV prox calf           yes    >500 ms      0.65                       +--------------+---------+------+-----------+------------+----------------+   GSV mid calf  no                            0.50                       +--------------+---------+------+-----------+------------+----------------+   GSV dist calf no                            0.47                       +--------------+---------+------+-----------+------------+----------------+   SSV prox calf no                            0.51                       +--------------+---------+------+-----------+------------+----------------+   SSV mid calf  no                            0.44                       +--------------+---------+------+-----------+------------+----------------+          Summary:  Right:  - No evidence of acute deep vein thrombosis seen in the right lower  extremity, from the  common femoral through the popliteal veins.  - No evidence of superficial venous reflux seen in the right short  saphenous vein.  - Venous reflux is noted in the right common femoral vein.  - Venous reflux is noted in the right sapheno-femoral junction.  - Venous reflux is noted in the right greater saphenous vein in the thigh.  - Venous reflux is noted in the right greater saphenous vein in the calf.  - Venous reflux is noted in the right popliteal vein.  - Evidence of chronic deep vein thrombosis in the mid femoral vein and  popliteal vein. This is stable from the previous study.      Assessment and Plan: 44 year old male with history as above.  We discussed that he does have a large refluxing greater saphenous vein but with his history of femoral vein chronic DVT I would be reticent to ever treat venous reflux.  If he did have worsening symptoms we could consider treatment of venous reflux however I would likely  perform venogram prior.  He will remain on Eliquis  indefinitely.  At this time his symptoms are stable over many years and I will see him on an as-needed basis although we will need to continue to fill his Eliquis  as needed and this was discussed with the patient today and he demonstrates good understanding.  Follow Up Instructions:   Follow up as needed  I spent 10 minutes with the patient via telephone encounter.   Signed, Penne [place] [facility] [place] Office: [phone]  03/26/2024, 4:11 PM     [1]  No outpatient medications have been marked as taking for the 03/26/24 encounter (Appointment) with [place] Penne Bruckner, MD.   "

## 2024-03-29 ENCOUNTER — Other Ambulatory Visit: Payer: Self-pay | Admitting: Vascular Surgery
